# Patient Record
Sex: Female | Born: 1970 | Race: Black or African American | Hispanic: No | Smoking: Never smoker
Health system: Southern US, Community
[De-identification: ages and names within clinical notes are randomized; demographics above are authoritative.]

## PROBLEM LIST (undated history)

## (undated) DIAGNOSIS — D259 Leiomyoma of uterus, unspecified: Secondary | ICD-10-CM

## (undated) DIAGNOSIS — I10 Essential (primary) hypertension: Secondary | ICD-10-CM

## (undated) DIAGNOSIS — T7840XA Allergy, unspecified, initial encounter: Secondary | ICD-10-CM

## (undated) DIAGNOSIS — Z5189 Encounter for other specified aftercare: Secondary | ICD-10-CM

## (undated) HISTORY — DX: Encounter for other specified aftercare: Z51.89

## (undated) HISTORY — DX: Allergy, unspecified, initial encounter: T78.40XA

## (undated) HISTORY — DX: Leiomyoma of uterus, unspecified: D25.9

---

## 1997-07-08 ENCOUNTER — Other Ambulatory Visit: Admission: RE | Admit: 1997-07-08 | Discharge: 1997-07-08 | Payer: Self-pay | Admitting: Internal Medicine

## 2011-12-22 ENCOUNTER — Emergency Department (HOSPITAL_COMMUNITY)
Admission: EM | Admit: 2011-12-22 | Discharge: 2011-12-22 | Disposition: A | Discharge status: Home / Self Care | Payer: Self-pay | Attending: Emergency Medicine | Admitting: Emergency Medicine

## 2011-12-22 ENCOUNTER — Encounter (HOSPITAL_COMMUNITY): Payer: Self-pay | Admitting: *Deleted

## 2011-12-22 DIAGNOSIS — N390 Urinary tract infection, site not specified: Secondary | ICD-10-CM | POA: Insufficient documentation

## 2011-12-22 LAB — URINALYSIS, ROUTINE W REFLEX MICROSCOPIC
Glucose, UA: NEGATIVE mg/dL
Hgb urine dipstick: NEGATIVE
Ketones, ur: NEGATIVE mg/dL
Protein, ur: NEGATIVE mg/dL

## 2011-12-22 MED ORDER — CEPHALEXIN 500 MG PO CAPS: 500.0000 mg | ORAL_CAPSULE | Freq: Two times a day (BID) | ORAL | Status: DC

## 2011-12-22 NOTE — ED Notes (Signed)
Patient states she thinks she has uti, pressure, and frequent urination.  She is also complaining of back pain.  She has had sx for 2 weeks that is worsened

## 2011-12-22 NOTE — ED Provider Notes (Signed)
History    This chart was scribed for Derwood Kaplan, MD, MD by Smitty Pluck. The patient was seen in room TR08C and the patient's care was started at 7:01PM.   CSN: [id number]  Arrival date & time 12/22/11  1620      Chief Complaint  Patient presents with  . Urinary Tract Infection    (Consider location/radiation/quality/duration/timing/severity/associated sxs/prior treatment) Patient is a 41 y.o. female presenting with urinary tract infection. The history is provided by the patient. No language interpreter was used.  Urinary Tract Infection   Ann Bird is a 41 y.o. female who presents to the Emergency Department complaining of constant, moderate bladder pressure and frequency onset 2 weeks ago. Pt reports having lower back pain. She reports that symptoms have worsened since onset. She states that she has taken AZO medication without relief. She reports that she has hx of kidney infection, kidney stones and UTI. Reports this does not feel like past kidney stones but feels similar to past UTI. Pt denies vaginal discharge, hx of STD and risky sexual behavior.   History reviewed. No pertinent past medical history.  History reviewed. No pertinent past surgical history.  No family history on file.  History  Substance Use Topics  . Smoking status: Never Smoker   . Smokeless tobacco: Not on file  . Alcohol Use: Yes    OB History    Grav Para Term Preterm Abortions TAB SAB Ect Mult Living                  Review of Systems  Genitourinary: Positive for frequency.  Musculoskeletal: Positive for back pain.  All other systems reviewed and are negative.    Allergies  Review of patient's allergies indicates not on file.  Home Medications  No current outpatient prescriptions on file.  BP 129/83  Pulse 97  Temp 98.5 F (36.9 C) (Oral)  Resp 16  Ht 5\' 1"  (1.549 m)  Wt 185 lb (83.915 kg)  BMI 34.96 kg/m2  SpO2 100%  Physical Exam  Nursing note and vitals  reviewed. Constitutional: She is oriented to person, place, and time. She appears well-developed and well-nourished. No distress.  HENT:  Head: Normocephalic and atraumatic.  Eyes: Conjunctivae normal are normal.  Neck: Normal range of motion. Neck supple.  Cardiovascular: Normal rate, regular rhythm and normal heart sounds.   Pulmonary/Chest: Effort normal and breath sounds normal. No respiratory distress. She has no wheezes. She has no rales.  Abdominal: Soft. She exhibits no distension. There is no tenderness. There is no CVA tenderness.  Neurological: She is alert and oriented to person, place, and time.  Skin: Skin is warm and dry.  Psychiatric: She has a normal mood and affect. Her behavior is normal.    ED Course  Procedures (including critical care time) DIAGNOSTIC STUDIES: Oxygen Saturation is 100% on room air, normal by my interpretation.    COORDINATION OF CARE: 7:05 PM Discussed ED treatment with pt     Labs Reviewed  URINALYSIS, ROUTINE W REFLEX MICROSCOPIC   No results found.   No diagnosis found.    MDM  Medical screening examination/treatment/procedure(s) were performed by me as the supervising physician. Scribe service was utilized for documentation only.  Pt comes in with cc of UTI. Has hs of UTI, and is having dysuria, flank pain and, polyuria. Appears to have pyelonephritis, though no fevers, chills, nausea noted. UA appears normal. Pt has completely non tender abd exam , has no vaginal dc, no  risk for STD. Will treat as a uti, will culture.   Derwood Kaplan, MD 12/22/11 2025

## 2011-12-22 NOTE — ED Notes (Signed)
Called lab urinalysis negative.

## 2011-12-22 NOTE — ED Notes (Signed)
Lab called for results states that they will tube to POD A

## 2011-12-24 LAB — URINE CULTURE: Colony Count: 9000

## 2012-10-30 ENCOUNTER — Encounter (HOSPITAL_COMMUNITY): Payer: Self-pay | Admitting: *Deleted

## 2012-10-30 ENCOUNTER — Emergency Department (HOSPITAL_COMMUNITY)
Admission: EM | Admit: 2012-10-30 | Discharge: 2012-10-30 | Disposition: A | Discharge status: Home / Self Care | Payer: BC Managed Care – PPO | Source: Home / Self Care | Attending: Family Medicine | Admitting: Family Medicine

## 2012-10-30 DIAGNOSIS — R300 Dysuria: Secondary | ICD-10-CM

## 2012-10-30 LAB — POCT URINALYSIS DIP (DEVICE)
Hgb urine dipstick: NEGATIVE
Protein, ur: 100 mg/dL — AB
Specific Gravity, Urine: >=1.03 (ref 1.005–1.030)
Urobilinogen, UA: 0.2 mg/dL (ref 0.0–1.0)

## 2012-10-30 MED ORDER — TRAMADOL HCL 50 MG PO TABS: 50.0000 mg | ORAL_TABLET | Freq: Four times a day (QID) | ORAL | Status: DC | PRN

## 2012-10-30 MED ORDER — CEPHALEXIN 500 MG PO CAPS: 500.0000 mg | ORAL_CAPSULE | Freq: Three times a day (TID) | ORAL | Status: DC

## 2012-10-30 MED ORDER — PHENAZOPYRIDINE HCL 200 MG PO TABS: 200.0000 mg | ORAL_TABLET | Freq: Three times a day (TID) | ORAL | Status: DC

## 2012-10-30 NOTE — ED Notes (Signed)
C/o pressure after urinating and feeling like she still has to go onset Monday. C/o bil. flank pain onset last week 8/14.  No fever.  No hematuria. She is taking Azo.

## 2012-10-30 NOTE — ED Provider Notes (Signed)
CSN: [id number]     Arrival date & time 10/30/12  1552 History     First MD Initiated Contact with Patient 10/30/12 1622     Chief Complaint  Patient presents with  . Urinary Tract Infection   (Consider location/radiation/quality/duration/timing/severity/associated sxs/prior Treatment) HPI Comments: 42 year old female nonsmoker with no significant past medical history. Here complaining of suprapubic pressure, urinary frequency and discomfort in urination for one week. Symptoms associated with bilateral low back pain. No hematuria. Denies fever or chills. Denies nausea vomiting or diarrhea. No pelvic pain. No vaginal discharge from what space line for her. Patient presents with similar symptoms in the emergency department in October last year her urine culture did grow 9,000 colonies of different morphotypes. She states her symptoms resolved with cephalexin.    History reviewed. No pertinent past medical history. History reviewed. No pertinent past surgical history. Family History  Problem Relation Age of Onset  . Pulmonary embolism Mother   . Colon cancer Father    History  Substance Use Topics  . Smoking status: Never Smoker   . Smokeless tobacco: Not on file  . Alcohol Use: Yes     Comment: occasional   OB History   Grav Para Term Preterm Abortions TAB SAB Ect Mult Living                 Review of Systems  Constitutional: Negative for fever, chills, diaphoresis, activity change, appetite change and fatigue.  Gastrointestinal: Negative for nausea, vomiting, abdominal pain, diarrhea and constipation.  Genitourinary: Positive for dysuria and frequency. Negative for hematuria, vaginal bleeding, vaginal discharge, genital sores and pelvic pain.  Musculoskeletal: Positive for back pain.  Skin: Negative for rash.  Neurological: Negative for dizziness and headaches.  All other systems reviewed and are negative.    Allergies  Review of patient's allergies indicates no known  allergies.  Home Medications   Current Outpatient Rx  Name  Route  Sig  Dispense  Refill  . cephALEXin (KEFLEX) 500 MG capsule   Oral   Take 1 capsule (500 mg total) by mouth 3 (three) times daily.   21 capsule   0   . phenazopyridine (PYRIDIUM) 200 MG tablet   Oral   Take 1 tablet (200 mg total) by mouth 3 (three) times daily.   6 tablet   0   . traMADol (ULTRAM) 50 MG tablet   Oral   Take 1 tablet (50 mg total) by mouth every 6 (six) hours as needed for pain.   15 tablet   0    LMP 09/23/2012 Physical Exam  Nursing note and vitals reviewed. Constitutional: She is oriented to person, place, and time. She appears well-developed and well-nourished. No distress.  HENT:  Head: Normocephalic and atraumatic.  Mouth/Throat: Oropharynx is clear and moist.  Cardiovascular: Normal heart sounds.   Pulmonary/Chest: Breath sounds normal.  Abdominal: Soft. Bowel sounds are normal. She exhibits no distension and no mass. There is no rebound and no guarding.  Suprapubic discomfort with deep palpation. No CVT bilateral.   Neurological: She is alert and oriented to person, place, and time.  Skin: No rash noted. She is not diaphoretic.    ED Course   Procedures (including critical care time)  Labs Reviewed  POCT URINALYSIS DIP (DEVICE) - Abnormal; Notable for the following:    Protein, ur 100 (*)    Nitrite POSITIVE (*)    All other components within normal limits  GLUCOSE, CAPILLARY   No results found. 1. Dysuria  MDM  Urine sent for culture. Treated with Keflex, pyridium and tramadol. Supportive care and red flags that should prompt her return to medical attention discussed with patient and provided in writing. Specifically recommended patient to followup with her primary care provider or urologist if recurrent symptoms.  Sharin Grave, MD 11/01/12 [phone]

## 2012-10-31 LAB — URINE CULTURE: Special Requests: NORMAL

## 2012-11-01 MED ORDER — METRONIDAZOLE 500 MG PO TABS: 500.0000 mg | ORAL_TABLET | Freq: Two times a day (BID) | ORAL | Status: DC

## 2013-03-11 ENCOUNTER — Encounter (HOSPITAL_COMMUNITY): Payer: Self-pay | Admitting: Emergency Medicine

## 2013-03-11 ENCOUNTER — Emergency Department (HOSPITAL_COMMUNITY)
Admission: EM | Admit: 2013-03-11 | Discharge: 2013-03-11 | Disposition: A | Discharge status: Home / Self Care | Payer: BC Managed Care – PPO | Source: Home / Self Care

## 2013-03-11 DIAGNOSIS — R0982 Postnasal drip: Secondary | ICD-10-CM

## 2013-03-11 DIAGNOSIS — J069 Acute upper respiratory infection, unspecified: Secondary | ICD-10-CM

## 2013-03-11 MED ORDER — IPRATROPIUM BROMIDE 0.06 % NA SOLN: 2.0000 | Freq: Four times a day (QID) | NASAL | Status: DC

## 2013-03-11 NOTE — ED Notes (Signed)
42 yr old is here today complaints of cough-yellow, chest congestion, and sorethroat for three weeks. She states she has tried OTC medications with no success. She states her breathing seems alittle off from time to time. Denies: Chest pain

## 2013-03-11 NOTE — ED Provider Notes (Signed)
CSN: [id number]     Arrival date & time 03/11/13  0813 History   First MD Initiated Contact with Patient 03/11/13 [phone]     Chief Complaint  Patient presents with  . URI   (Consider location/radiation/quality/duration/timing/severity/associated sxs/prior Treatment) HPI Comments: 42 year old female complaining of URI symptoms with cough, draining and sore throat for 3-4 weeks.   History reviewed. No pertinent past medical history. History reviewed. No pertinent past surgical history. Family History  Problem Relation Age of Onset  . Pulmonary embolism Mother   . Colon cancer Father    History  Substance Use Topics  . Smoking status: Never Smoker   . Smokeless tobacco: Not on file  . Alcohol Use: Yes     Comment: occasional   OB History   Grav Para Term Preterm Abortions TAB SAB Ect Mult Living                 Review of Systems  Constitutional: Negative for fever, chills, activity change, appetite change and fatigue.  HENT: Positive for congestion, postnasal drip, rhinorrhea and sore throat. Negative for facial swelling.   Eyes: Negative.   Respiratory: Negative.  Negative for chest tightness and wheezing.   Cardiovascular: Negative.   Gastrointestinal: Negative.   Musculoskeletal: Negative for neck pain and neck stiffness.  Skin: Negative for pallor and rash.  Neurological: Negative.     Allergies  Review of patient's allergies indicates no known allergies.  Home Medications   Current Outpatient Rx  Name  Route  Sig  Dispense  Refill  . cephALEXin (KEFLEX) 500 MG capsule   Oral   Take 1 capsule (500 mg total) by mouth 3 (three) times daily.   21 capsule   0   . ipratropium (ATROVENT) 0.06 % nasal spray   Each Nare   Place 2 sprays into both nostrils 4 (four) times daily.   15 mL   12   . phenazopyridine (PYRIDIUM) 200 MG tablet   Oral   Take 1 tablet (200 mg total) by mouth 3 (three) times daily.   6 tablet   0   . traMADol (ULTRAM) 50 MG tablet  Oral   Take 1 tablet (50 mg total) by mouth every 6 (six) hours as needed for pain.   15 tablet   0    BP 128/86  Pulse 86  Temp(Src) 98.4 F (36.9 C) (Oral)  Resp 16  Ht 5\' 1"  (1.549 m)  Wt 190 lb (86.183 kg)  BMI 35.92 kg/m2  SpO2 99%  LMP 03/06/2013 Physical Exam  Nursing note and vitals reviewed. Constitutional: She is oriented to person, place, and time. She appears well-developed and well-nourished. No distress.  HENT:  Mouth/Throat: No oropharyngeal exudate.  Bilateral TMs are normal Oropharynx with minimal erythema and clear PND.  Eyes: Conjunctivae and EOM are normal.  Neck: Normal range of motion. Neck supple.  Cardiovascular: Normal rate, regular rhythm and normal heart sounds.   Pulmonary/Chest: Effort normal and breath sounds normal. No respiratory distress. She has no wheezes. She has no rales.  Musculoskeletal: Normal range of motion. She exhibits no edema.  Lymphadenopathy:    She has no cervical adenopathy.  Neurological: She is alert and oriented to person, place, and time.  Skin: Skin is warm and dry. No rash noted.  Psychiatric: She has a normal mood and affect.    ED Course  Procedures (including critical care time) Labs Review Labs Reviewed - No data to display Imaging Review No results found.  MDM   1. URI (upper respiratory infection)   2. PND (post-nasal drip)    Alka-Seltzer cold nighttime plus medication Cough or nondrowsy may use Claritin or Allegra Drink plenty of fluids Atrovent nasal spray Instructions for treating URI symptoms.   Hayden Rasmussen, NP 03/11/13 [phone]

## 2013-03-15 NOTE — ED Provider Notes (Signed)
Medical screening examination/treatment/procedure(s) were performed by a resident physician or non-physician practitioner and as the supervising physician I was immediately available for consultation/collaboration.  Lynne Leader, MD    Gregor Hams, MD 03/15/13 [phone]

## 2013-04-16 ENCOUNTER — Ambulatory Visit (INDEPENDENT_AMBULATORY_CARE_PROVIDER_SITE_OTHER): Payer: No Typology Code available for payment source | Admitting: Physician Assistant

## 2013-04-16 VITALS — BP 112/72 | HR 120 | Temp 99.1°F | Resp 16 | Ht 61.0 in | Wt 194.0 lb

## 2013-04-16 DIAGNOSIS — R059 Cough, unspecified: Secondary | ICD-10-CM

## 2013-04-16 DIAGNOSIS — R05 Cough: Secondary | ICD-10-CM

## 2013-04-16 DIAGNOSIS — J069 Acute upper respiratory infection, unspecified: Secondary | ICD-10-CM

## 2013-04-16 MED ORDER — HYDROCOD POLST-CHLORPHEN POLST 10-8 MG/5ML PO LQCR: 5.0000 mL | Freq: Two times a day (BID) | ORAL | Status: AC

## 2013-04-16 MED ORDER — AZITHROMYCIN 250 MG PO TABS
ORAL_TABLET | ORAL | Status: AC
Start: 2013-04-16 — End: 2013-04-21

## 2013-04-16 NOTE — Progress Notes (Signed)
   Subjective:    Patient ID: Ann Bird, female    DOB: Dec 01, 1970, 43 y.o.   MRN: [id number]  HPI Pt presents to clinic with cold symptoms for about 5 days. She had the Flu at the beginning of January and then she got better except for the cough which never resolved and now she has a productive cough (all day) with some nasal congestion but with clear rhinorrhea.  OTC meds - Nyquil, Mucinex Sick contacts - work Flu vaccine - no -- thinks she had the flu about 3-4 weeks ago Review of Systems  Constitutional: Positive for fever (subjective) and chills.  HENT: Positive for congestion, postnasal drip, rhinorrhea (clear) and sore throat (resolved).   Respiratory: Positive for cough (yellowish).   Gastrointestinal: Negative for nausea, vomiting and diarrhea.  Musculoskeletal: Positive for myalgias.  Neurological: Positive for headaches.  All other systems reviewed and are negative.       Objective:   Physical Exam  Vitals reviewed. Constitutional: She is oriented to person, place, and time. She appears well-developed and well-nourished.  HENT:  Head: Normocephalic and atraumatic.  Right Ear: Hearing, tympanic membrane, external ear and ear canal normal.  Left Ear: Hearing, tympanic membrane, external ear and ear canal normal.  Nose: Mucosal edema (red) present.  Mouth/Throat: Uvula is midline, oropharynx is clear and moist and mucous membranes are normal.  Eyes: Conjunctivae are normal.  Neck: Normal range of motion. Neck supple.  Cardiovascular: Normal rate, regular rhythm and normal heart sounds.   No murmur heard. Pulmonary/Chest: Effort normal and breath sounds normal. She has no wheezes.  Lymphadenopathy:    She has no cervical adenopathy.  Neurological: She is alert and oriented to person, place, and time.  Skin: Skin is warm and dry.  Psychiatric: She has a normal mood and affect. Her behavior is normal. Judgment and thought content normal.      Assessment & Plan:    Cough - Plan: azithromycin (ZITHROMAX Z-PAK) 250 MG tablet, chlorpheniramine-HYDROcodone (TUSSIONEX PENNKINETIC ER) 10-8 MG/5ML LQCR  URI (upper respiratory infection)  Symptomatic care d/w pt. We will cover for atypical bacteria since she has had a cough for about 4 weeks and her sputum is a different color than her am rhinorrhea.  Windell Hummingbird PA-C 04/16/2013 4:57 PM

## 2013-04-16 NOTE — Patient Instructions (Signed)
Please push fluids.  Tylenol and Motrin for fever and body aches.    

## 2013-08-18 ENCOUNTER — Encounter (HOSPITAL_COMMUNITY): Payer: Self-pay | Admitting: Emergency Medicine

## 2013-08-18 ENCOUNTER — Other Ambulatory Visit (HOSPITAL_COMMUNITY)
Admission: RE | Admit: 2013-08-18 | Discharge: 2013-08-18 | Disposition: A | Discharge status: Home / Self Care | Payer: PRIVATE HEALTH INSURANCE | Source: Ambulatory Visit | Attending: Emergency Medicine | Admitting: Emergency Medicine

## 2013-08-18 ENCOUNTER — Emergency Department (HOSPITAL_COMMUNITY)
Admission: EM | Admit: 2013-08-18 | Discharge: 2013-08-18 | Disposition: A | Discharge status: Home / Self Care | Payer: PRIVATE HEALTH INSURANCE | Source: Home / Self Care | Attending: Emergency Medicine | Admitting: Emergency Medicine

## 2013-08-18 DIAGNOSIS — Z113 Encounter for screening for infections with a predominantly sexual mode of transmission: Secondary | ICD-10-CM | POA: Insufficient documentation

## 2013-08-18 DIAGNOSIS — N760 Acute vaginitis: Secondary | ICD-10-CM | POA: Insufficient documentation

## 2013-08-18 LAB — POCT URINALYSIS DIP (DEVICE)
Bilirubin Urine: NEGATIVE
GLUCOSE, UA: NEGATIVE mg/dL
HGB URINE DIPSTICK: NEGATIVE
KETONES UR: NEGATIVE mg/dL
Nitrite: NEGATIVE
PROTEIN: 100 mg/dL — AB
UROBILINOGEN UA: 0.2 mg/dL (ref 0.0–1.0)
pH: 6.5 (ref 5.0–8.0)

## 2013-08-18 LAB — POCT PREGNANCY, URINE: PREG TEST UR: NEGATIVE

## 2013-08-18 MED ORDER — METRONIDAZOLE 500 MG PO TABS: 500.0000 mg | ORAL_TABLET | Freq: Two times a day (BID) | ORAL | Status: DC

## 2013-08-18 NOTE — ED Provider Notes (Signed)
Medical screening examination/treatment/procedure(s) were performed by non-physician practitioner and as supervising physician I was immediately available for consultation/collaboration.  Philipp Deputy, M.D.  Harden Mo, MD 08/18/13 2226

## 2013-08-18 NOTE — Discharge Instructions (Signed)
Bacterial Vaginosis Bacterial vaginosis is a vaginal infection that occurs when the normal balance of bacteria in the vagina is disrupted. It results from an overgrowth of certain bacteria. This is the most common vaginal infection in women of childbearing age. Treatment is important to prevent complications, especially in pregnant women, as it can cause a premature delivery. CAUSES  Bacterial vaginosis is caused by an increase in harmful bacteria that are normally present in smaller amounts in the vagina. Several different kinds of bacteria can cause bacterial vaginosis. However, the reason that the condition develops is not fully understood. RISK FACTORS Certain activities or behaviors can put you at an increased risk of developing bacterial vaginosis, including:  Having a new sex partner or multiple sex partners.  Douching.  Using an intrauterine device (IUD) for contraception. Women do not get bacterial vaginosis from toilet seats, bedding, swimming pools, or contact with objects around them. SIGNS AND SYMPTOMS  Some women with bacterial vaginosis have no signs or symptoms. Common symptoms include:  Grey vaginal discharge.  A fishlike odor with discharge, especially after sexual intercourse.  Itching or burning of the vagina and vulva.  Burning or pain with urination. DIAGNOSIS  Your health care provider will take a medical history and examine the vagina for signs of bacterial vaginosis. A sample of vaginal fluid may be taken. Your health care provider will look at this sample under a microscope to check for bacteria and abnormal cells. A vaginal pH test may also be done.  TREATMENT  Bacterial vaginosis may be treated with antibiotic medicines. These may be given in the form of a pill or a vaginal cream. A second round of antibiotics may be prescribed if the condition comes back after treatment.  HOME CARE INSTRUCTIONS   Only take over-the-counter or prescription medicines as  directed by your health care provider.  If antibiotic medicine was prescribed, take it as directed. Make sure you finish it even if you start to feel better.  Do not have sex until treatment is completed.  Tell all sexual partners that you have a vaginal infection. They should see their health care provider and be treated if they have problems, such as a mild rash or itching.  Practice safe sex by using condoms and only having one sex partner. SEEK MEDICAL CARE IF:   Your symptoms are not improving after 3 days of treatment.  You have increased discharge or pain.  You have a fever. MAKE SURE YOU:   Understand these instructions.  Will watch your condition.  Will get help right away if you are not doing well or get worse. FOR MORE INFORMATION  Centers for Disease Control and Prevention, Division of STD Prevention: [employer] American Sexual Health Association (ASHA): www.ashastd.org  Document Released: 02/26/2005 Document Revised: 12/17/2012 Document Reviewed: 10/08/2012 [facility] Patient Information 2014 [place].  Vaginitis Vaginitis is an inflammation of the vagina. It is most often caused by a change in the normal balance of the bacteria and yeast that live in the vagina. This change in balance causes an overgrowth of certain bacteria or yeast, which causes the inflammation. There are different types of vaginitis, but the most common types are:  Bacterial vaginosis.  Yeast infection (candidiasis).  Trichomoniasis vaginitis. This is a sexually transmitted infection (STI).  Viral vaginitis.  Atropic vaginitis.  Allergic vaginitis. CAUSES  The cause depends on the type of vaginitis. Vaginitis can be caused by:  Bacteria (bacterial vaginosis).  Yeast (yeast infection).  A parasite (trichomoniasis vaginitis)  A virus (viral vaginitis).  Low hormone levels (atrophic vaginitis). Low hormone levels can occur during pregnancy, breastfeeding, or after  menopause.  Irritants, such as bubble baths, scented tampons, and feminine sprays (allergic vaginitis). Other factors can change the normal balance of the yeast and bacteria that live in the vagina. These include:  Antibiotic medicines.  Poor hygiene.  Diaphragms, vaginal sponges, spermicides, birth control pills, and intrauterine devices (IUD).  Sexual intercourse.  Infection.  Uncontrolled diabetes.  A weakened immune system. SYMPTOMS  Symptoms can vary depending on the cause of the vaginitis. Common symptoms include:  Abnormal vaginal discharge.  The discharge is white, gray, or yellow with bacterial vaginosis.  The discharge is thick, white, and cheesy with a yeast infection.  The discharge is frothy and yellow or greenish with trichomoniasis.  A bad vaginal odor.  The odor is fishy with bacterial vaginosis.  Vaginal itching, pain, or swelling.  Painful intercourse.  Pain or burning when urinating. Sometimes, there are no symptoms. TREATMENT  Treatment will vary depending on the type of infection.   Bacterial vaginosis and trichomoniasis are often treated with antibiotic creams or pills.  Yeast infections are often treated with antifungal medicines, such as vaginal creams or suppositories.  Viral vaginitis has no cure, but symptoms can be treated with medicines that relieve discomfort. Your sexual partner should be treated as well.  Atrophic vaginitis may be treated with an estrogen cream, pill, suppository, or vaginal ring. If vaginal dryness occurs, lubricants and moisturizing creams may help. You may be told to avoid scented soaps, sprays, or douches.  Allergic vaginitis treatment involves quitting the use of the product that is causing the problem. Vaginal creams can be used to treat the symptoms. HOME CARE INSTRUCTIONS   Take all medicines as directed by your caregiver.  Keep your genital area clean and dry. Avoid soap and only rinse the area with  water.  Avoid douching. It can remove the healthy bacteria in the vagina.  Do not use tampons or have sexual intercourse until your vaginitis has been treated. Use sanitary pads while you have vaginitis.  Wipe from front to back. This avoids the spread of bacteria from the rectum to the vagina.  Let air reach your genital area.  Wear cotton underwear to decrease moisture buildup.  Avoid wearing underwear while you sleep until your vaginitis is gone.  Avoid tight pants and underwear or nylons without a cotton panel.  Take off wet clothing (especially bathing suits) as soon as possible.  Use mild, non-scented products. Avoid using irritants, such as:  Scented feminine sprays.  Fabric softeners.  Scented detergents.  Scented tampons.  Scented soaps or bubble baths.  Practice safe sex and use condoms. Condoms may prevent the spread of trichomoniasis and viral vaginitis. SEEK MEDICAL CARE IF:   You have abdominal pain.  You have a fever or persistent symptoms for more than 2 3 days.  You have a fever and your symptoms suddenly get worse. Document Released: 12/24/2006 Document Revised: 11/21/2011 Document Reviewed: 08/09/2011 [facility] Patient Information 2014 [place].

## 2013-08-18 NOTE — ED Notes (Signed)
PT  REPORTS  SYMPTOMS  OF    UTI  TO  INCLUDE  LOW  BACK PAI  SLIGHT  DISCHARGE  AS  WELL  AAS  DISCOMFORT      X  1       WEEK    SYMPTOMS  NOT RELEIVED  BY  AZO

## 2013-08-18 NOTE — ED Provider Notes (Signed)
CSN: [id number]     Arrival date & time 08/18/13  6812 History   First MD Initiated Contact with Patient 08/18/13 1028     Chief Complaint  Patient presents with  . Urinary Frequency   (Consider location/radiation/quality/duration/timing/severity/associated sxs/prior Treatment) HPI Comments: Reports suprapubic abdominal discomfort, lower back discomfort, urinary frequency without associated fever, flank pain, N/V, chills or hematuria over past 6 days. Also mentions new yellow vaginal discharge. No vaginal bleeding No polyuria or polydipsia LNMP: 07-31-2013 PCP: none   Patient is a 43 y.o. female presenting with frequency. The history is provided by the patient.  Urinary Frequency This is a new problem. Episode onset: sx began 6 days ago. The problem has been gradually worsening.    History reviewed. No pertinent past medical history. History reviewed. No pertinent past surgical history. Family History  Problem Relation Age of Onset  . Pulmonary embolism Mother   . Colon cancer Father    History  Substance Use Topics  . Smoking status: Never Smoker   . Smokeless tobacco: Not on file  . Alcohol Use: Yes     Comment: occasional   OB History   Grav Para Term Preterm Abortions TAB SAB Ect Mult Living                 Review of Systems  Genitourinary: Positive for frequency.  All other systems reviewed and are negative.   Allergies  Review of patient's allergies indicates no known allergies.  Home Medications   Prior to Admission medications   Medication Sig Start Date End Date Taking? Authorizing Provider  metroNIDAZOLE (FLAGYL) 500 MG tablet Take 1 tablet (500 mg total) by mouth 2 (two) times daily. 08/18/13   Annett Gula Iriana Artley, PA   BP 124/88  Pulse 90  Temp(Src) 98.2 F (36.8 C) (Oral)  Resp 16  SpO2 98%  LMP 07/31/2013 Physical Exam  Nursing note and vitals reviewed. Constitutional: She is oriented to person, place, and time. She appears well-developed and  well-nourished. No distress.  HENT:  Head: Normocephalic and atraumatic.  Eyes: Conjunctivae are normal.  Cardiovascular: Normal rate, regular rhythm and normal heart sounds.   Pulmonary/Chest: Effort normal and breath sounds normal.  Abdominal: Soft. Bowel sounds are normal. She exhibits no distension. There is tenderness in the suprapubic area. There is no CVA tenderness.  Mild pressure sensation in suprapubic region of abdomen  Genitourinary: Uterus normal. There is no rash, tenderness or lesion on the right labia. There is no rash, tenderness or lesion on the left labia. Cervix exhibits no motion tenderness, no discharge and no friability. Right adnexum displays no mass, no tenderness and no fullness. Left adnexum displays no mass, no tenderness and no fullness. No erythema, tenderness or bleeding around the vagina. No foreign body around the vagina. No signs of injury around the vagina. Vaginal discharge found.  Moderate thin yellow vaginal discharge  Musculoskeletal: Normal range of motion.  Neurological: She is alert and oriented to person, place, and time.  Skin: Skin is warm and dry. No rash noted. No erythema.  Psychiatric: She has a normal mood and affect. Her behavior is normal.    ED Course  Procedures (including critical care time) Labs Review Labs Reviewed  POCT URINALYSIS DIP (DEVICE) - Abnormal; Notable for the following:    Protein, ur 100 (*)    Leukocytes, UA TRACE (*)    All other components within normal limits  POCT PREGNANCY, URINE  CERVICOVAGINAL ANCILLARY ONLY    Imaging Review  No results found.   MDM   1. Vaginitis    UA grossly normal. Will send specimen for C&S. Patient denies concerns for STIs and does not wish to be treated empirically for gonorrhea and chlamydia. Will treat for BV and await additional lab results to determine if additional treatment needed.     [place], [place] 08/18/13 1126

## 2013-08-20 NOTE — ED Notes (Signed)
GC/Chlamydia neg., Affirm: Candida and Trich neg., Gardnerella pos.  Pt. adequately treated  With Flagyl. Roselyn Meier 08/20/2013

## 2013-12-11 ENCOUNTER — Encounter (HOSPITAL_COMMUNITY): Payer: Self-pay | Admitting: Emergency Medicine

## 2013-12-11 ENCOUNTER — Emergency Department (HOSPITAL_COMMUNITY)
Admission: EM | Admit: 2013-12-11 | Discharge: 2013-12-11 | Disposition: A | Discharge status: Home / Self Care | Payer: No Typology Code available for payment source | Source: Home / Self Care | Attending: Family Medicine | Admitting: Family Medicine

## 2013-12-11 DIAGNOSIS — N390 Urinary tract infection, site not specified: Secondary | ICD-10-CM

## 2013-12-11 LAB — POCT URINALYSIS DIP (DEVICE)
BILIRUBIN URINE: NEGATIVE
Glucose, UA: 100 mg/dL — AB
Hgb urine dipstick: NEGATIVE
KETONES UR: NEGATIVE mg/dL
Nitrite: POSITIVE — AB
Protein, ur: 100 mg/dL — AB
SPECIFIC GRAVITY, URINE: 1.02 (ref 1.005–1.030)
Urobilinogen, UA: 0.2 mg/dL (ref 0.0–1.0)
pH: 5 (ref 5.0–8.0)

## 2013-12-11 LAB — POCT PREGNANCY, URINE: PREG TEST UR: NEGATIVE

## 2013-12-11 MED ORDER — CEPHALEXIN 500 MG PO CAPS: 500.0000 mg | ORAL_CAPSULE | Freq: Four times a day (QID) | ORAL | Status: DC

## 2013-12-11 NOTE — ED Notes (Signed)
Patient c/o frequent urination x 1 week. Patient reports she has a small amount of discharge. She did take AZO tablets with mild pain relief. Patient is alert and oriented and in no acute distress.

## 2013-12-11 NOTE — Discharge Instructions (Signed)
Take all of medicine as directed, drink lots of fluids, see your doctor if further problems. °

## 2013-12-11 NOTE — ED Provider Notes (Signed)
CSN: [id number]     Arrival date & time 12/11/13  0820 History   First MD Initiated Contact with Patient 12/11/13 [phone]     Chief Complaint  Patient presents with  . Urinary Tract Infection   (Consider location/radiation/quality/duration/timing/severity/associated sxs/prior Treatment) Patient is a 43 y.o. female presenting with urinary tract infection. The history is provided by the patient.  Urinary Tract Infection This is a new problem. The current episode started more than 1 week ago. The problem has not changed since onset.Pertinent negatives include no abdominal pain.    History reviewed. No pertinent past medical history. History reviewed. No pertinent past surgical history. Family History  Problem Relation Age of Onset  . Pulmonary embolism Mother   . Colon cancer Father    History  Substance Use Topics  . Smoking status: Never Smoker   . Smokeless tobacco: Not on file  . Alcohol Use: Yes     Comment: occasional   OB History   Grav Para Term Preterm Abortions TAB SAB Ect Mult Living                 Review of Systems  Constitutional: Negative.   Gastrointestinal: Negative for abdominal pain.  Genitourinary: Positive for frequency. Negative for dysuria, urgency, hematuria, vaginal bleeding and menstrual problem.    Allergies  Review of patient's allergies indicates no known allergies.  Home Medications   Prior to Admission medications   Medication Sig Start Date End Date Taking? Authorizing Provider  cephALEXin (KEFLEX) 500 MG capsule Take 1 capsule (500 mg total) by mouth 4 (four) times daily. Take all of medicine and drink lots of fluids 12/11/13   Billy Fischer, MD  metroNIDAZOLE (FLAGYL) 500 MG tablet Take 1 tablet (500 mg total) by mouth 2 (two) times daily. 08/18/13   Audelia Hives Presson, PA   BP 121/81  Pulse 78  Temp(Src) 99.1 F (37.3 C) (Oral)  Resp 14  SpO2 98%  LMP 10/22/2013 Physical Exam  Nursing note and vitals reviewed. Constitutional: She  is oriented to person, place, and time. She appears well-developed and well-nourished.  Abdominal: Soft. Bowel sounds are normal. She exhibits no distension and no mass. There is no tenderness. There is no rebound and no guarding.  Neurological: She is alert and oriented to person, place, and time.  Skin: Skin is warm and dry.    ED Course  Procedures (including critical care time) Labs Review Labs Reviewed  POCT PREGNANCY, URINE    Imaging Review No results found.   MDM   1. UTI (lower urinary tract infection)        Billy Fischer, MD 12/11/13 [phone]

## 2014-09-09 ENCOUNTER — Emergency Department (INDEPENDENT_AMBULATORY_CARE_PROVIDER_SITE_OTHER): Payer: No Typology Code available for payment source

## 2014-09-09 ENCOUNTER — Encounter (HOSPITAL_COMMUNITY): Payer: Self-pay | Admitting: *Deleted

## 2014-09-09 ENCOUNTER — Emergency Department (HOSPITAL_COMMUNITY)
Admission: EM | Admit: 2014-09-09 | Discharge: 2014-09-09 | Disposition: A | Discharge status: Home / Self Care | Payer: No Typology Code available for payment source | Source: Home / Self Care | Attending: Family Medicine | Admitting: Family Medicine

## 2014-09-09 DIAGNOSIS — R0982 Postnasal drip: Secondary | ICD-10-CM

## 2014-09-09 DIAGNOSIS — R0789 Other chest pain: Secondary | ICD-10-CM

## 2014-09-09 DIAGNOSIS — R0600 Dyspnea, unspecified: Secondary | ICD-10-CM

## 2014-09-09 NOTE — Discharge Instructions (Signed)
Chest Pain (Nonspecific) For any increasing chest pain, heaviness, tightness or fullness, shortness of breath, sweating, nausea or vomiting or worsening in any way call EMS or go directly to the emergency department. Otherwise follow-up with your PCP, call tomorrow for an appointment. Additional evaluation by cardiology may be necessary.  It is often hard to give a specific diagnosis for the cause of chest pain. There is always a chance that your pain could be related to something serious, such as a heart attack or a blood clot in the lungs. You need to follow up with your health care provider for further evaluation. CAUSES   Heartburn.  Pneumonia or bronchitis.  Anxiety or stress.  Inflammation around your heart (pericarditis) or lung (pleuritis or pleurisy).  A blood clot in the lung.  A collapsed lung (pneumothorax). It can develop suddenly on its own (spontaneous pneumothorax) or from trauma to the chest.  Shingles infection (herpes zoster virus). The chest wall is composed of bones, muscles, and cartilage. Any of these can be the source of the pain.  The bones can be bruised by injury.  The muscles or cartilage can be strained by coughing or overwork.  The cartilage can be affected by inflammation and become sore (costochondritis). DIAGNOSIS  Lab tests or other studies may be needed to find the cause of your pain. Your health care provider may have you take a test called an ambulatory electrocardiogram (ECG). An ECG records your heartbeat patterns over a 24-hour period. You may also have other tests, such as:  Transthoracic echocardiogram (TTE). During echocardiography, sound waves are used to evaluate how blood flows through your heart.  Transesophageal echocardiogram (TEE).  Cardiac monitoring. This allows your health care provider to monitor your heart rate and rhythm in real time.  Holter monitor. This is a portable device that records your heartbeat and can help diagnose  heart arrhythmias. It allows your health care provider to track your heart activity for several days, if needed.  Stress tests by exercise or by giving medicine that makes the heart beat faster. TREATMENT   Treatment depends on what may be causing your chest pain. Treatment may include:  Acid blockers for heartburn.  Anti-inflammatory medicine.  Pain medicine for inflammatory conditions.  Antibiotics if an infection is present.  You may be advised to change lifestyle habits. This includes stopping smoking and avoiding alcohol, caffeine, and chocolate.  You may be advised to keep your head raised (elevated) when sleeping. This reduces the chance of acid going backward from your stomach into your esophagus. Most of the time, nonspecific chest pain will improve within 2-3 days with rest and mild pain medicine.  HOME CARE INSTRUCTIONS   If antibiotics were prescribed, take them as directed. Finish them even if you start to feel better.  For the next few days, avoid physical activities that bring on chest pain. Continue physical activities as directed.  Do not use any tobacco products, including cigarettes, chewing tobacco, or electronic cigarettes.  Avoid drinking alcohol.  Only take medicine as directed by your health care provider.  Follow your health care provider's suggestions for further testing if your chest pain does not go away.  Keep any follow-up appointments you made. If you do not go to an appointment, you could develop lasting (chronic) problems with pain. If there is any problem keeping an appointment, call to reschedule. SEEK MEDICAL CARE IF:   Your chest pain does not go away, even after treatment.  You have a rash with  blisters on your chest.  You have a fever. SEEK IMMEDIATE MEDICAL CARE IF:   You have increased chest pain or pain that spreads to your arm, neck, jaw, back, or abdomen.  You have shortness of breath.  You have an increasing cough, or you  cough up blood.  You have severe back or abdominal pain.  You feel nauseous or vomit.  You have severe weakness.  You faint.  You have chills. This is an emergency. Do not wait to see if the pain will go away. Get medical help at once. Call your local emergency services (911 in [place] Do not drive yourself to the hospital. MAKE SURE YOU:   Understand these instructions.  Will watch your condition.  Will get help right away if you are not doing well or get worse. Document Released: 12/06/2004 Document Revised: 03/03/2013 Document Reviewed: 10/02/2007 [facility] Patient Information 2015 [place], [place]. This information is not intended to replace advice given to you by your health care provider. Make sure you discuss any questions you have with your health care provider.  Costochondritis Costochondritis, sometimes called Tietze syndrome, is a swelling and irritation (inflammation) of the tissue (cartilage) that connects your ribs with your breastbone (sternum). It causes pain in the chest and rib area. Costochondritis usually goes away on its own over time. It can take up to 6 weeks or longer to get better, especially if you are unable to limit your activities. CAUSES  Some cases of costochondritis have no known cause. Possible causes include:  Injury (trauma).  Exercise or activity such as lifting.  Severe coughing. SIGNS AND SYMPTOMS  Pain and tenderness in the chest and rib area.  Pain that gets worse when coughing or taking deep breaths.  Pain that gets worse with specific movements. DIAGNOSIS  Your health care provider will do a physical exam and ask about your symptoms. Chest X-rays or other tests may be done to rule out other problems. TREATMENT  Costochondritis usually goes away on its own over time. Your health care provider may prescribe medicine to help relieve pain. HOME CARE INSTRUCTIONS   Avoid exhausting physical activity. Try not to strain your ribs during  normal activity. This would include any activities using chest, abdominal, and side muscles, especially if heavy weights are used.  Apply ice to the affected area for the first 2 days after the pain begins.  Put ice in a plastic bag.  Place a towel between your skin and the bag.  Leave the ice on for 20 minutes, 2-3 times a day.  Only take over-the-counter or prescription medicines as directed by your health care provider. SEEK MEDICAL CARE IF:  You have redness or swelling at the rib joints. These are signs of infection.  Your pain does not go away despite rest or medicine. SEEK IMMEDIATE MEDICAL CARE IF:   Your pain increases or you are very uncomfortable.  You have shortness of breath or difficulty breathing.  You cough up blood.  You have worse chest pains, sweating, or vomiting.  You have a fever or persistent symptoms for more than 2-3 days.  You have a fever and your symptoms suddenly get worse. MAKE SURE YOU:   Understand these instructions.  Will watch your condition.  Will get help right away if you are not doing well or get worse. Document Released: 12/06/2004 Document Revised: 12/17/2012 Document Reviewed: 09/30/2012 [facility] Patient Information 2015 [place], [place]. This information is not intended to replace advice given to you by your  health care provider. Make sure you discuss any questions you have with your health care provider.  Chest Wall Pain Chest wall pain is pain felt in or around the chest bones and muscles. It may take up to 6 weeks to get better. It may take longer if you are active. Chest wall pain can happen on its own. Other times, things like germs, injury, coughing, or exercise can cause the pain. HOME CARE   Avoid activities that make you tired or cause pain. Try not to use your chest, belly (abdominal), or side muscles. Do not use heavy weights.  Put ice on the sore area.  Put ice in a plastic bag.  Place a towel between your skin and  the bag.  Leave the ice on for 15-20 minutes for the first 2 days.  Only take medicine as told by your doctor. GET HELP RIGHT AWAY IF:   You have more pain or are very uncomfortable.  You have a fever.  Your chest pain gets worse.  You have new problems.  You feel sick to your stomach (nauseous) or throw up (vomit).  You start to sweat or feel lightheaded.  You have a cough with mucus (phlegm).  You cough up blood. MAKE SURE YOU:   Understand these instructions.  Will watch your condition.  Will get help right away if you are not doing well or get worse. Document Released: 08/15/2007 Document Revised: 05/21/2011 Document Reviewed: 10/23/2010 [facility] Patient Information 2015 [place], [place]. This information is not intended to replace advice given to you by your health care provider. Make sure you discuss any questions you have with your health care provider.  Shortness of Breath Shortness of breath means you have trouble breathing. Shortness of breath needs medical care right away. HOME CARE   Do not smoke.  Avoid being around chemicals or things (paint fumes, dust) that may bother your breathing.  Rest as needed. Slowly begin your normal activities.  Only take medicines as told by your doctor.  Keep all doctor visits as told. GET HELP RIGHT AWAY IF:   Your shortness of breath gets worse.  You feel lightheaded, pass out (faint), or have a cough that is not helped by medicine.  You cough up blood.  You have pain with breathing.  You have pain in your chest, arms, shoulders, or belly (abdomen).  You have a fever.  You cannot walk up stairs or exercise the way you normally do.  You do not get better in the time expected.  You have a hard time doing normal activities even with rest.  You have problems with your medicines.  You have any new symptoms. MAKE SURE YOU:  Understand these instructions.  Will watch your condition.  Will get help right  away if you are not doing well or get worse. Document Released: 08/15/2007 Document Revised: 03/03/2013 Document Reviewed: 05/14/2011 [facility] Patient Information 2015 [place], [place]. This information is not intended to replace advice given to you by your health care provider. Make sure you discuss any questions you have with your health care provider.

## 2014-09-09 NOTE — ED Provider Notes (Signed)
CSN: [id number]     Arrival date & time 09/09/14  1639 History   First MD Initiated Contact with Patient 09/09/14 1713     Chief Complaint  Patient presents with  . Chest Pain   (Consider location/radiation/quality/duration/timing/severity/associated sxs/prior Treatment) HPI Comments: 44 year old female states that 2 weeks ago she had upper respiratory congestion, PND, cough, sinus congestion and drainage into her chest that she was diagnosed as bronchitis and treated with antibiotics. The ABX's did not seem to work and she had the symptoms for several additional days. Most of the symptoms have now abated and she is now complaining of tightness and heaviness in the mid chest as well as breathing heavier. She does not call it shortness of breath. She currently has minor PND particularly in the morning.  she states that just a few days ago she was walking and had mild heaviness in the mid chest that when it started raining she began to run a short distance and the heaviness and tightness increased substantially.   No hx of HTN, dyslipidemia, smoking, DM.   History reviewed. No pertinent past medical history. History reviewed. No pertinent past surgical history. Family History  Problem Relation Age of Onset  . Pulmonary embolism Mother   . Colon cancer Father    History  Substance Use Topics  . Smoking status: Never Smoker   . Smokeless tobacco: Not on file  . Alcohol Use: Yes     Comment: occasional   OB History    No data available     Review of Systems  Constitutional: Negative.   HENT: Negative.   Respiratory: Positive for chest tightness and shortness of breath. Negative for choking and wheezing.   Cardiovascular: Positive for chest pain. Negative for palpitations.  Gastrointestinal: Negative.   Skin: Negative.   Neurological: Negative.     Allergies  Review of patient's allergies indicates no known allergies.  Home Medications   Prior to Admission medications    Medication Sig Start Date End Date Taking? Authorizing Provider  cephALEXin (KEFLEX) 500 MG capsule Take 1 capsule (500 mg total) by mouth 4 (four) times daily. Take all of medicine and drink lots of fluids 12/11/13   Billy Fischer, MD  metroNIDAZOLE (FLAGYL) 500 MG tablet Take 1 tablet (500 mg total) by mouth 2 (two) times daily. 08/18/13   Annett Gula H Presson, PA   BP 132/78 mmHg  Pulse 78  Temp(Src) 98.6 F (37 C) (Oral)  Resp 18  SpO2 100%  LMP 09/03/2014 Physical Exam  Constitutional: She is oriented to person, place, and time. She appears well-developed and well-nourished. No distress.  Relaxed posturing. No acute distress. No outward evidence of shortness of breath. Warm and dry. Speech is relaxed and lucid.  HENT:  Clear PND in the oropharynx. No exudates.  Eyes: EOM are normal.  Neck: Normal range of motion. Neck supple.  Cardiovascular: Normal rate, regular rhythm and normal heart sounds.   No murmur heard. Pulmonary/Chest: Effort normal and breath sounds normal. No respiratory distress. She has no wheezes. She has no rales. She exhibits tenderness.  There is tenderness along the bilateral parasternal borders, however, the patient states this is not the same pain for which she is presenting.   Musculoskeletal: She exhibits no edema.  Neurological: She is alert and oriented to person, place, and time.  Skin: Skin is warm and dry.  Psychiatric: She has a normal mood and affect.  Nursing note and vitals reviewed.   ED Course  Procedures (including critical care time) Labs Review Labs Reviewed - No data to display  Imaging Review Dg Chest 2 View  09/09/2014   CLINICAL DATA:  midline chest pain for two weeks, SOB and cough. No history of cardiac or respiratory disease, or diabetes. Non-smoker  EXAM: CHEST  2 VIEW  COMPARISON:  None.  FINDINGS: The heart size and mediastinal contours are within normal limits. Both lungs are clear. No pleural effusion or pneumothorax. The  visualized skeletal structures are unremarkable.  IMPRESSION: No active cardiopulmonary disease.   Electronically Signed   By: Lajean Manes M.D.   On: 09/09/2014 18:29   ED ECG REPORT   Date: 09/09/2014  Rate: 93  Rhythm: normal sinus rhythm  QRS Axis: normal  Intervals: normal  ST/T Wave abnormalities: normal  Conduction Disutrbances:none  Narrative Interpretation:   Old EKG Reviewed: none available  I have personally reviewed the EKG tracing and agree with the computerized printout as noted.   MDM   1. Atypical chest pain   2. Dyspnea   3. PND (post-nasal drip)   4. Chest wall pain     MD CALC Heart Score 2 Pt was offered the opportunity to go to the emergency Department for additional evaluation that we are unable to perform in the urgent care. She declined to do so and prefers to follow-up with her PCP.  For any increasing chest pain, heaviness, tightness or fullness, shortness of breath, sweating, nausea or vomiting or worsening in any way call EMS or go directly to the emergency department. Otherwise follow-up with your PCP, call tomorrow for an appointment. Additional evaluation by cardiology may be necessary.      Janne Napoleon, NP 09/09/14 1901

## 2014-09-09 NOTE — ED Notes (Signed)
Pt  Reports   Symptoms  Of     Cough /  Congestion        Tightness  In  Her chest     Symptoms  X  2  Weeks         Pt    Reports       That       She  Has  Been on  Anti  Biotics        Pt     Sitting  Upright     On  The  Exam table    spealking  In  Complete  sentances

## 2015-08-25 ENCOUNTER — Other Ambulatory Visit (HOSPITAL_COMMUNITY)
Admission: RE | Admit: 2015-08-25 | Discharge: 2015-08-25 | Disposition: A | Discharge status: Home / Self Care | Payer: BLUE CROSS/BLUE SHIELD | Source: Ambulatory Visit | Attending: Family Medicine | Admitting: Family Medicine

## 2015-08-25 ENCOUNTER — Ambulatory Visit (INDEPENDENT_AMBULATORY_CARE_PROVIDER_SITE_OTHER): Payer: BLUE CROSS/BLUE SHIELD | Admitting: Family Medicine

## 2015-08-25 ENCOUNTER — Encounter: Payer: Self-pay | Admitting: Family Medicine

## 2015-08-25 VITALS — BP 121/82 | HR 60 | Temp 98.7°F | Ht 61.0 in | Wt 196.2 lb

## 2015-08-25 DIAGNOSIS — N898 Other specified noninflammatory disorders of vagina: Secondary | ICD-10-CM

## 2015-08-25 DIAGNOSIS — N926 Irregular menstruation, unspecified: Secondary | ICD-10-CM

## 2015-08-25 DIAGNOSIS — N760 Acute vaginitis: Secondary | ICD-10-CM | POA: Insufficient documentation

## 2015-08-25 DIAGNOSIS — N3000 Acute cystitis without hematuria: Secondary | ICD-10-CM

## 2015-08-25 DIAGNOSIS — R300 Dysuria: Secondary | ICD-10-CM

## 2015-08-25 LAB — POCT URINALYSIS DIPSTICK
Bilirubin, UA: NEGATIVE
Glucose, UA: NEGATIVE
Ketones, UA: NEGATIVE
NITRITE UA: NEGATIVE
RBC UA: NEGATIVE
Spec Grav, UA: >=1.03
UROBILINOGEN UA: 4
pH, UA: 6

## 2015-08-25 LAB — POCT URINE PREGNANCY: PREG TEST UR: NEGATIVE

## 2015-08-25 MED ORDER — NITROFURANTOIN MONOHYD MACRO 100 MG PO CAPS: 100.0000 mg | ORAL_CAPSULE | Freq: Two times a day (BID) | ORAL | Status: DC

## 2015-08-25 NOTE — Progress Notes (Signed)
Pre visit review using our clinic review tool, if applicable. No additional management support is needed unless otherwise documented below in the visit note. 

## 2015-08-25 NOTE — Patient Instructions (Signed)
It does appear that you have a UTI- I will be in touch with your urine culture resutls. We will wait on your vaginal swabs, but I do not see any sign of a vaginal infection so far Use the macrobid as directed and let me know if you do not feel better in the next couple of days- Sooner if worse.

## 2015-08-25 NOTE — Progress Notes (Addendum)
[facility] [address], [place], [place] [address] [id number] [phone]  Date:  08/25/2015   Name:  Ann Bird   DOB:  1970/12/25   MRN:  [id number]  PCP:  Lamar Blinks, MD    Chief Complaint: No chief complaint on file.   History of Present Illness:  Ann Bird is a 45 y.o. very pleasant female patient who presents with the following:  Here today as a new patient to establish care  She has noted more frequent urination, feeling of having to void again right after voiding for about 2 weeks; it will come and go.   No dysuria, no hematuria She has also noted a yellowish vaginal discharge.  No itching or burning No belly pain, no fever, appetite is ok  She is generally in good health   Her LMP was about 6 weeks ago. She will occasionally skip a cycle.  She wonders if she might be perimenopausal.  She does have known uterine fibroids.  She has 2 grown children and has had 2 miscarriages since.  She does not highly suspect pregnancy but we will certainly do a test for her today She denies any new sexual partners or concern of STI There are no active problems to display for this patient.   Past Medical History  Diagnosis Date  . Fibroid, uterine     No past surgical history on file.  Social History  Substance Use Topics  . Smoking status: Never Smoker   . Smokeless tobacco: Never Used  . Alcohol Use: 0.0 oz/week    0 Standard drinks or equivalent per week     Comment: occasional. 1 glass of wine every other week    Family History  Problem Relation Age of Onset  . Pulmonary embolism Mother   . Colon cancer Father     No Known Allergies  Medication list has been reviewed and updated.  Current Outpatient Prescriptions on File Prior to Visit  Medication Sig Dispense Refill  . cephALEXin (KEFLEX) 500 MG capsule Take 1 capsule (500 mg total) by mouth 4 (four) times daily. Take all of medicine and  drink lots of fluids 20 capsule 0  . metroNIDAZOLE (FLAGYL) 500 MG tablet Take 1 tablet (500 mg total) by mouth 2 (two) times daily. 14 tablet 0   No current facility-administered medications on file prior to visit.    Review of Systems:  As per HPI- otherwise negative.   Physical Examination: Filed Vitals:   08/25/15 1601  BP: 121/82  Pulse: 106  Temp: 98.7 F (37.1 C)   Filed Vitals:   08/25/15 1601  Height: 5\' 1"  (1.549 m)  Weight: 196 lb 3.2 oz (88.996 kg)   Body mass index is 37.09 kg/(m^2). Ideal Body Weight: Weight in (lb) to have BMI = 25: 132  GEN: WDWN, NAD, Non-toxic, A & O x 3, looks well, overweight HEENT: Atraumatic, Normocephalic. Neck supple. No masses, No LAD. Ears and Nose: No external deformity. CV: RRR, No M/G/R. No JVD. No thrill. No extra heart sounds. PULM: CTA B, no wheezes, crackles, rhonchi. No retractions. No resp. distress. No accessory muscle use. ABD: S, NT, ND. No rebound. No HSM. EXTR: No c/c/e NEURO Normal gait.  PSYCH: Normally interactive. Conversant. Not depressed or anxious appearing.  Calm demeanor.  Pelvic: normal, no vaginal lesions or discharge. Uterus normal, no CMT, no adnexal tendereness or masses  Results for orders placed or performed in  visit on 08/25/15  POCT urinalysis dipstick  Result Value Ref Range   Color, UA Yellow    Clarity, UA Clear    Glucose, UA Negative    Bilirubin, UA Negative    Ketones, UA Negative    Spec Grav, UA >=1.030    Blood, UA Negative    pH, UA 6.0    Protein, UA Trace    Urobilinogen, UA 4.0    Nitrite, UA Negative    Leukocytes, UA small (1+) (A) Negative   HCG negative today  Assessment and Plan: Acute cystitis without hematuria - Plan: nitrofurantoin, macrocrystal-monohydrate, (MACROBID) 100 MG capsule  Dysuria - Plan: POCT urinalysis dipstick, Urine culture  Irregular menses - Plan: POCT urine pregnancy  Vaginal discharge - Plan: GC/Chlamydia Probe Amp, Cervicovaginal  ancillary only here today with likely UTI macrobid while culture is pending Await other labs and will be in touch with her asap  See patient instructions for more details.     Signed Lamar Blinks, MD  Received labs 6/17.  Called and LMOM- urine culture low growth mixed bacteria. Let me know if sx persist  Results for orders placed or performed in visit on 08/25/15  Urine culture  Result Value Ref Range   Colony Count 10,000 COLONIES/ML    Organism ID, Bacteria Multiple bacterial morphotypes present, none    Organism ID, Bacteria predominant. Suggest appropriate recollection if     Organism ID, Bacteria clinically indicated.   GC/Chlamydia Probe Amp  Result Value Ref Range   CT Probe RNA NOT DETECTED    GC Probe RNA NOT DETECTED   POCT urinalysis dipstick  Result Value Ref Range   Color, UA Yellow    Clarity, UA Clear    Glucose, UA Negative    Bilirubin, UA Negative    Ketones, UA Negative    Spec Grav, UA >=1.030    Blood, UA Negative    pH, UA 6.0    Protein, UA Trace    Urobilinogen, UA 4.0    Nitrite, UA Negative    Leukocytes, UA small (1+) (A) Negative  POCT urine pregnancy  Result Value Ref Range   Preg Test, Ur Negative Negative   Received wet prep 6/20- positive for gardnerella but this is not necessarily pathologic.  Will send her a copy of labs

## 2015-08-26 LAB — URINE CULTURE

## 2015-08-26 LAB — GC/CHLAMYDIA PROBE AMP
CT Probe RNA: NOT DETECTED
GC Probe RNA: NOT DETECTED

## 2015-08-29 LAB — CERVICOVAGINAL ANCILLARY ONLY: Wet Prep (BD Affirm): POSITIVE — AB

## 2015-08-30 ENCOUNTER — Encounter: Payer: Self-pay | Admitting: Family Medicine

## 2016-01-13 ENCOUNTER — Ambulatory Visit (INDEPENDENT_AMBULATORY_CARE_PROVIDER_SITE_OTHER): Payer: BLUE CROSS/BLUE SHIELD | Admitting: Physician Assistant

## 2016-01-13 VITALS — BP 128/82 | HR 98 | Temp 98.2°F | Resp 17 | Ht 61.5 in | Wt 201.0 lb

## 2016-01-13 DIAGNOSIS — R300 Dysuria: Secondary | ICD-10-CM

## 2016-01-13 DIAGNOSIS — N390 Urinary tract infection, site not specified: Secondary | ICD-10-CM

## 2016-01-13 LAB — POCT CBC
Granulocyte percent: 68.9 % (ref 37–80)
HCT, POC: 35.9 % — AB (ref 37.7–47.9)
Hemoglobin: 12 g/dL — AB (ref 12.2–16.2)
Lymph, poc: 3.8 — AB (ref 0.6–3.4)
MCH, POC: 22.6 pg — AB (ref 27–31.2)
MCHC: 33.3 g/dL (ref 31.8–35.4)
MCV: 67.7 fL — AB (ref 80–97)
MID (cbc): 0.8 (ref 0–0.9)
MPV: 8.3 fL (ref 0–99.8)
POC Granulocyte: 10.1 — AB (ref 2–6.9)
POC LYMPH PERCENT: 25.7 %L (ref 10–50)
POC MID %: 5.4 %M (ref 0–12)
Platelet Count, POC: 388 10*3/uL (ref 142–424)
RBC: 5.31 M/uL (ref 4.04–5.48)
RDW, POC: 16.8 %
WBC: 14.6 10*3/uL — AB (ref 4.6–10.2)

## 2016-01-13 LAB — POC MICROSCOPIC URINALYSIS (UMFC): Mucus: ABSENT

## 2016-01-13 LAB — POCT URINALYSIS DIP (MANUAL ENTRY)
Bilirubin, UA: NEGATIVE
Blood, UA: NEGATIVE
Glucose, UA: NEGATIVE
Ketones, POC UA: NEGATIVE
NITRITE UA: POSITIVE — AB
Spec Grav, UA: 1.02
Urobilinogen, UA: 0.2
pH, UA: 5.5

## 2016-01-13 MED ORDER — NITROFURANTOIN MONOHYD MACRO 100 MG PO CAPS: 100.0000 mg | ORAL_CAPSULE | Freq: Two times a day (BID) | ORAL | 0 refills | Status: DC

## 2016-01-13 NOTE — Patient Instructions (Addendum)
  Thank you for coming in today. I hope you feel we met your needs.  Feel free to call [facility] if you have any questions or further requests.  Please consider signing up for MyChart if you do not already have it, as this is a great way to communicate with me.  Best,  Whitney McVey, PA-C    Asymptomatic Bacteriuria, Female Asymptomatic bacteriuria is the presence of a large number of bacteria in your urine without the usual symptoms of burning or frequent urination. The following conditions increase the risk of asymptomatic bacteriuria:  Diabetes mellitus.  Advanced age.  Pregnancy in the first trimester.  Kidney stones.  Kidney transplants.  Leaky kidney tube valve in young children (reflux). Treatment for this condition is not needed in most people and can lead to other problems such as too much yeast and growth of resistant bacteria. However, some people, such as pregnant women, do need treatment to prevent kidney infection. Asymptomatic bacteriuria in pregnancy is also associated with fetal growth restriction, premature labor, and newborn death. HOME CARE INSTRUCTIONS Monitor your condition for any changes. The following actions may help to relieve any discomfort you are feeling:  Drink enough water and fluids to keep your urine clear or pale yellow. Go to the bathroom more often to keep your bladder empty.  Keep the area around your vagina and rectum clean. Wipe yourself from front to back after urinating. SEEK IMMEDIATE MEDICAL CARE IF:  You develop signs of an infection such as:  Burning with urination.  Frequency of voiding.  Back pain.  Fever.  You have blood in the urine.  You develop a fever. MAKE SURE YOU:  Understand these instructions.  Will watch your condition.  Will get help right away if you are not doing well or get worse.   This information is not intended to replace advice given to you by your health care provider. Make sure you discuss any  questions you have with your health care provider.   Document Released: 02/26/2005 Document Revised: 03/19/2014 Document Reviewed: 08/18/2012 Elsevier Interactive Patient Education 2016 [employer].  IF you received an x-ray today, you will receive an invoice from [facility]. Please contact [facility] at [phone] with questions or concerns regarding your invoice.   IF you received labwork today, you will receive an invoice from [employer]. Please contact [facility] at [phone] with questions or concerns regarding your invoice.   Our billing staff will not be able to assist you with questions regarding bills from these companies.  You will be contacted with the lab results as soon as they are available. The fastest way to get your results is to activate your My Chart account. Instructions are located on the last page of this paperwork. If you have not heard from [place] regarding the results in 2 weeks, please contact this office.

## 2016-01-13 NOTE — Progress Notes (Signed)
SUBJECTIVE: Ann Bird is a 45 y.o. female who complains of urinary frequency, urgency and dysuria x 7 days. Notes associated lower abdominal and lower back pain. Her HR today is 106, rechecked it was 98.  Denies flank pain, fever, chills, or vaginal discharge or bleeding.   AZO for pain, some relief from pain. Reports taking three days of 500 mg Ciprofloxacin, which was her sister's, with no relief.   OBJECTIVE: Appears well, in no apparent distress.  She is tachycardic on presentation, recheck of HR is 98 bpm, otherwise vital signs are normal. The abdomen is soft, minor suprapubic tenderness, there is no guarding, mass, rebound or organomegaly. Minimal CVA tenderness, no inguinal adenopathy noted.    Results for orders placed or performed in visit on 01/13/16  POCT Microscopic Urinalysis ([facility])  Result Value Ref Range   WBC,UR,HPF,POC Few (A) None WBC/hpf   RBC,UR,HPF,POC None None RBC/hpf   Bacteria None None, Too numerous to count   Mucus Absent Absent   Epithelial Cells, UR Per Microscopy Few (A) None, Too numerous to count cells/hpf  POCT urinalysis dipstick  Result Value Ref Range   Color, UA yellow yellow   Clarity, UA cloudy (A) clear   Glucose, UA negative negative   Bilirubin, UA negative negative   Ketones, POC UA negative negative   Spec Grav, UA 1.020    Blood, UA negative negative   pH, UA 5.5    Protein Ur, POC trace (A) negative   Urobilinogen, UA 0.2    Nitrite, UA Positive (A) Negative   Leukocytes, UA small (1+) (A) Negative  POCT CBC  Result Value Ref Range   WBC 14.6 (A) 4.6 - 10.2 K/uL   Lymph, poc 3.8 (A) 0.6 - 3.4   POC LYMPH PERCENT 25.7 10 - 50 %L   MID (cbc) 0.8 0 - 0.9   POC MID % 5.4 0 - 12 %M   POC Granulocyte 10.1 (A) 2 - 6.9   Granulocyte percent 68.9 37 - 80 %G   RBC 5.31 4.04 - 5.48 M/uL   Hemoglobin 12.0 (A) 12.2 - 16.2 g/dL   HCT, POC 35.9 (A) 37.7 - 47.9 %   MCV 67.7 (A) 80 - 97 fL   MCH, POC 22.6 (A) 27 - 31.2 pg   MCHC 33.3 31.8  - 35.4 g/dL   RDW, POC 16.8 %   Platelet Count, POC 388 142 - 424 K/uL   MPV 8.3 0 - 99.8 fL    ASSESSMENT/PLAN:  1. Dysuria 2. Urinary tract infection without hematuria, site unspecified - POCT Microscopic Urinalysis ([facility]) - POCT urinalysis dipstick - POCT CBC - Urine culture - No concern for pyelonephritis at this time. Antibiotic stewardship discussed with patient. Encouraged her not to take antibiotics not as prescribed by a medical practitioner.  - nitrofurantoin, macrocrystal-monohydrate, (MACROBID) 100 MG capsule; Take 1 capsule (100 mg total) by mouth 2 (two) times daily.  Dispense: 20 capsule; Refill: 0 - Call or return to clinic prn if these symptoms worsen or fail to improve as anticipated. She understands and agrees with the plan.    [facility] January 13, 2016, 17:17

## 2016-01-15 LAB — URINE CULTURE: Organism ID, Bacteria: NO GROWTH

## 2016-01-26 ENCOUNTER — Telehealth: Payer: Self-pay

## 2016-01-26 NOTE — Telephone Encounter (Signed)
Patient stated she came in the other day and saw [facility], she was given antibiotics but they have not cleared up the infection and she wants to know what she needs to do now.  Her call back number is [phone]

## 2016-01-27 ENCOUNTER — Telehealth: Payer: Self-pay | Admitting: Emergency Medicine

## 2016-01-27 ENCOUNTER — Ambulatory Visit: Payer: BLUE CROSS/BLUE SHIELD

## 2016-01-27 NOTE — Telephone Encounter (Signed)
Ann Bird, Patient  States, after completing prescribed Macrobid, she noticed UTI sx's return. Bladder pressure, urine freq/urge with radiating lower back pain. Denies fever,chills,n,v,hematuria.   Do you want to prescribe Pyridium or do you want her to return to the clinic?  Please advise

## 2016-01-28 NOTE — Telephone Encounter (Signed)
Thank you. Please have her come back in this afternoon.  She should be evaluated by a provider, considering she has back pain.

## 2016-01-30 NOTE — Telephone Encounter (Signed)
Called pt back to check status after the weekend. Had to LM advising that if she is still having Sxs, espt back pain she should call to make appt to come back for re-eval. If her Sxs have improved, she does not need to CB.

## 2016-02-15 ENCOUNTER — Ambulatory Visit (INDEPENDENT_AMBULATORY_CARE_PROVIDER_SITE_OTHER): Payer: BLUE CROSS/BLUE SHIELD | Admitting: Family Medicine

## 2016-02-15 VITALS — BP 128/82 | HR 96 | Temp 98.3°F | Resp 17 | Ht 61.5 in | Wt 205.0 lb

## 2016-02-15 DIAGNOSIS — L209 Atopic dermatitis, unspecified: Secondary | ICD-10-CM

## 2016-02-15 DIAGNOSIS — Z131 Encounter for screening for diabetes mellitus: Secondary | ICD-10-CM | POA: Diagnosis not present

## 2016-02-15 DIAGNOSIS — R21 Rash and other nonspecific skin eruption: Secondary | ICD-10-CM

## 2016-02-15 LAB — GLUCOSE, POCT (MANUAL RESULT ENTRY): POC Glucose: 84 mg/dl (ref 70–99)

## 2016-02-15 MED ORDER — TRIAMCINOLONE ACETONIDE 0.1 % EX CREA: 1.0000 "application " | TOPICAL_CREAM | Freq: Two times a day (BID) | CUTANEOUS | 0 refills | Status: DC

## 2016-02-15 MED ORDER — PREDNISONE 20 MG PO TABS: 40.0000 mg | ORAL_TABLET | Freq: Every day | ORAL | 0 refills | Status: DC

## 2016-02-15 NOTE — Progress Notes (Signed)
By signing my name below I, Tereasa Coop, attest that this documentation has been prepared under the direction and in the presence of Wendie Agreste, MD. Electonically Signed. Tereasa Coop, Scribe 02/15/2016 at 5:30 PM   Subjective:    Patient ID: Ann Bird, female    DOB: [birth year], 45 y.o.   MRN: [id number]  Chief Complaint  Patient presents with  . Rash    face, around neck, hands     HPI Ann Bird is a 45 y.o. female who presents to the [facility] complaining of pruritic rash. Rash started on her face and neck and has started to spread to the dorsum of both hands. Rash first started a little more than a week ago. Denies rash on trunk, genital region, or legs. Rash started after she had tracks placed in her hair. Pt removed tracks after rash appeared.  Denies any new soaps, detergents, facial cleanser, jewelry. Pt used mud cleanser a week ago after the rash started and stated the cleanser burned and she took it off. Rash did not worsen after the mud cleanser was used. Pt also stopped using lotions with no change in rash.   Pt last seen on 01/13/16 for a UTI. Treated with Macrobid for 10 days.   Pt states the last time her blood sugar was checked was more than 2 months ago.   There are no active problems to display for this patient.  Past Medical History:  Diagnosis Date  . Fibroid, uterine    No past surgical history on file. No Known Allergies Prior to Admission medications   Medication Sig Start Date End Date Taking? Authorizing Provider  IRON, FERROUS SULFATE, PO Take 1 tablet by mouth. Every other day   Yes Historical Provider, MD  Multiple Vitamin (MULTIVITAMIN) tablet Take 1 tablet by mouth daily.   Yes Historical Provider, MD  nitrofurantoin, macrocrystal-monohydrate, (MACROBID) 100 MG capsule Take 1 capsule (100 mg total) by mouth 2 (two) times daily. Patient not taking: Reported on 02/15/2016 01/13/16   Gelene Mink McVey, PA-C    Social History   Social History  . Marital status: Divorced    Spouse name: N/A  . Number of children: N/A  . Years of education: N/A   Occupational History  . Not on file.   Social History Main Topics  . Smoking status: Never Smoker  . Smokeless tobacco: Never Used  . Alcohol use 0.0 oz/week     Comment: occasional. 1 glass of wine every other week  . Drug use: No  . Sexual activity: Yes    Birth control/ protection: None   Other Topics Concern  . Not on file   Social History Narrative  . No narrative on file      Review of Systems  Skin: Positive for rash.       Objective:   Physical Exam  Constitutional: She is oriented to person, place, and time. She appears well-developed and well-nourished. No distress.  HENT:  Head: Normocephalic and atraumatic.  Eyes: Conjunctivae are normal. Pupils are equal, round, and reactive to light.  Neck: Neck supple.  Cardiovascular: Normal rate.   Pulmonary/Chest: Effort normal.  Musculoskeletal: Normal range of motion.  Neurological: She is alert and oriented to person, place, and time.  Skin: Skin is warm and dry.  Pt has fine erythematous patches across her face and dorsal hands bilat. Pt has hyperpigmentation and slight dry skin on anterior neck. Upper chest, back, arms, legs,  interdigital areas of hands are clear of rash.  Psychiatric: She has a normal mood and affect. Her behavior is normal.  Nursing note and vitals reviewed.    Vitals:   02/15/16 1604  BP: 128/82  Pulse: 96  Resp: 17  Temp: 98.3 F (36.8 C)  TempSrc: Oral  SpO2: 99%  Weight: 205 lb (93 kg)  Height: 5' 1.5" (1.562 m)    Results for orders placed or performed in visit on 02/15/16  POCT glucose (manual entry)  Result Value Ref Range   POC Glucose 84 70 - 99 mg/dl        Assessment & Plan:     Ann Bird is a 45 y.o. female Screening for diabetes mellitus - Plan: POCT glucose (manual entry)  Rash of face - Plan: predniSONE  (DELTASONE) 20 MG tablet  Rash and nonspecific skin eruption - Plan: predniSONE (DELTASONE) 20 MG tablet, triamcinolone cream (KENALOG) 0.1 %  Atopic dermatitis, unspecified type - Plan: triamcinolone cream (KENALOG) 0.1 %  Suspected contact dermatitis versus component of atopic dermatitis/eczema, especially on dorsum of hands. Initially we'll try prednisone 40 mg daily 3 days due to involvement of face and neck. Then switch to triamcinolone 0.1% topical twice a day to affected areas as needed. Eucerin over-the-counter twice a day to dry/scaling areas. RTC precautions if persistent. Glucose okay (checked for prednisone use)  Meds ordered this encounter  Medications  . predniSONE (DELTASONE) 20 MG tablet    Sig: Take 2 tablets (40 mg total) by mouth daily with breakfast.    Dispense:  6 tablet    Refill:  0  . triamcinolone cream (KENALOG) 0.1 %    Sig: Apply 1 application topically 2 (two) times daily.    Dispense:  30 g    Refill:  0   Patient Instructions   Your rash could be a combination of contact dermatitis due to one of the dermatologic products or possible new detergent, as well as some component of eczema. Prednisone 40 mg per day for the next 3 days, then triamcinolone cream if needed for persistent itching areas up to twice per day. For face, neck  - use Cetaphil cleanser/moisturizer, for hands use Eucerin lotion - twice per day to affected areas. Remaining in the next 1 week to 10 days, or any worsening sooner, return for recheck.     Contact Dermatitis Introduction Dermatitis is redness, soreness, and swelling (inflammation) of the skin. Contact dermatitis is a reaction to certain substances that touch the skin. There are two types of contact dermatitis:  Irritant contact dermatitis. This type is caused by something that irritates your skin, such as dry hands from washing them too much. This type does not require previous exposure to the substance for a reaction to occur.  This type is more common.  Allergic contact dermatitis. This type is caused by a substance that you are allergic to, such as a nickel allergy or poison ivy. This type only occurs if you have been exposed to the substance (allergen) before. Upon a repeat exposure, your body reacts to the substance. This type is less common. What are the causes? Many different substances can cause contact dermatitis. Irritant contact dermatitis is most commonly caused by exposure to:  Makeup.  Soaps.  Detergents.  Bleaches.  Acids.  Metal salts, such as nickel. Allergic contact dermatitis is most commonly caused by exposure to:  Poisonous plants.  Chemicals.  Jewelry.  Latex.  Medicines.  Preservatives in products, such as  clothing. What increases the risk? This condition is more likely to develop in:  People who have jobs that expose them to irritants or allergens.  People who have certain medical conditions, such as asthma or eczema. What are the signs or symptoms? Symptoms of this condition may occur anywhere on your body where the irritant has touched you or is touched by you. Symptoms include:  Dryness or flaking.  Redness.  Cracks.  Itching.  Pain or a burning feeling.  Blisters.  Drainage of small amounts of blood or clear fluid from skin cracks. With allergic contact dermatitis, there may also be swelling in areas such as the eyelids, mouth, or genitals. How is this diagnosed? This condition is diagnosed with a medical history and physical exam. A patch skin test may be performed to help determine the cause. If the condition is related to your job, you may need to see an occupational medicine specialist. How is this treated? Treatment for this condition includes figuring out what caused the reaction and protecting your skin from further contact. Treatment may also include:  Steroid creams or ointments. Oral steroid medicines may be needed in more severe  cases.  Antibiotics or antibacterial ointments, if a skin infection is present.  Antihistamine lotion or an antihistamine taken by mouth to ease itching.  A bandage (dressing). Follow these instructions at home: [place] your skin as needed.  Apply cool compresses to the affected areas.  Try taking a bath with:  Epsom salts. Follow the instructions on the packaging. You can get these at your local pharmacy or grocery store.  Baking soda. Pour a small amount into the bath as directed by your health care provider.  Colloidal oatmeal. Follow the instructions on the packaging. You can get this at your local pharmacy or grocery store.  Try applying baking soda paste to your skin. Stir water into baking soda until it reaches a paste-like consistency.  Do not scratch your skin.  Bathe less frequently, such as every other day.  Bathe in lukewarm water. Avoid using hot water. Medicines  Take or apply over-the-counter and prescription medicines only as told by your health care provider.  If you were prescribed an antibiotic medicine, take or apply your antibiotic as told by your health care provider. Do not stop using the antibiotic even if your condition starts to improve. General instructions  Keep all follow-up visits as told by your health care provider. This is important.  Avoid the substance that caused your reaction. If you do not know what caused it, keep a journal to try to track what caused it. Write down:  What you eat.  What cosmetic products you use.  What you drink.  What you wear in the affected area. This includes jewelry.  If you were given a dressing, take care of it as told by your health care provider. This includes when to change and remove it. Contact a health care provider if:  Your condition does not improve with treatment.  Your condition gets worse.  You have signs of infection such as swelling, tenderness, redness, soreness, or  warmth in the affected area.  You have a fever.  You have new symptoms. Get help right away if:  You have a severe headache, neck pain, or neck stiffness.  You vomit.  You feel very sleepy.  You notice red streaks coming from the affected area.  Your bone or joint underneath the affected area becomes painful after the skin has healed.  The affected area turns darker.  You have difficulty breathing. This information is not intended to replace advice given to you by your health care provider. Make sure you discuss any questions you have with your health care provider. Document Released: 02/24/2000 Document Revised: 08/04/2015 Document Reviewed: 07/14/2014  2017 Elsevier  Atopic Dermatitis Atopic dermatitis is a skin disorder that causes inflammation of the skin. This is the most common type of eczema. Eczema is a group of skin conditions that cause the skin to be itchy, red, and swollen. This condition is generally worse during the cooler winter months and often improves during the warm summer months. Symptoms can vary from person to person. Atopic dermatitis usually starts showing signs in infancy and can last through adulthood. This condition cannot be passed from one person to another (non-contagious), but is more common in families. Atopic dermatitis may not always be present. When it is present, it is called a flare-up. What are the causes? The exact cause of this condition is not known. Flare-ups of the condition may be triggered by:  Contact with something you are sensitive or allergic to.  Stress.  Certain foods.  Extremely hot or cold weather.  Harsh chemicals and soaps.  Dry air.  Chlorine. What increases the risk? This condition is more likely to develop in people who have a personal history or family history of eczema, allergies, asthma, or hay fever. What are the signs or symptoms? Symptoms of this condition include:  Dry, scaly skin.  Red, itchy  rash.  Itchiness, which can be severe. This may occur before the skin rash. This can make sleeping difficult.  Skin thickening and cracking can occur over time. How is this diagnosed? This condition is diagnosed based on your symptoms, a medical history, and a physical exam. How is this treated? There is no cure for this condition, but symptoms can usually be controlled. Treatment focuses on:  Controlling the itching and scratching. You may be given medicines, such as antihistamines or steroid creams.  Limiting exposure to things that you are sensitive or allergic to (allergens).  Recognizing situations that cause stress and developing a plan to manage stress. If your atopic dermatitis does not get better with medicines or is all over your body (widespread) , a treatment using a specific type of light (phototherapy) may be used. Follow these instructions at home: Skin care  Keep your skin well-moisturized. This seals in moisture and help prevent dryness.  Use unscented lotions that have petroleum in them.  Avoid lotions that contain alcohol and water. They can dry the skin.  Keep baths or showers short (less than 5 minutes) in warm water. Do not use hot water.  Use mild, unscented cleansers for bathing. Avoid soap and bubble bath.  Apply a moisturizer to your skin right after a bath or shower.   Do not apply anything to your skin without checking with your health care provider. General instructions  Dress in clothes made of cotton or cotton blends. Dress lightly because heat increases itching.  When washing your clothes, rinse your clothes twice so all of the soap is removed.  Avoid any triggers that can cause a flare-up.  Try to manage your stress.  Keep your fingernails cut short.  Avoid scratching. Scratching makes the rash and itching worse. It may also result in a skin infection (impetigo) due to a break in the skin caused by scratching.  Take or apply  over-the-counter and prescription medicines only as told by your  health care provider.  Keep all follow-up visits as told by your health care provider. This is important.  Do not be around people who have cold sores or fever blisters. If you get the infection, it may cause your atopic dermatitis to worsen. Contact a health care provider if:  Your itching interferes with sleep.  Your rash gets worse or is not better within one week of starting treatment.  You have a fever.  You have a rash flare-up after having contact with someone who has cold sores or fever blisters. Get help right away if:  You develop pus or soft yellow scabs in the rash area. Summary  This condition causes a red rash and itchy, dry, scaly skin.  Treatment focuses on controlling the itching and scratching, limiting exposure to things that you are sensitive or allergic to (allergens), and recognizing situations that cause stress and developing a plan to manage stress.  Keep your skin well-moisturized.  Keep baths or showers less than 5 minutes. This information is not intended to replace advice given to you by your health care provider. Make sure you discuss any questions you have with your health care provider. Document Released: 02/24/2000 Document Revised: 08/04/2015 Document Reviewed: 09/29/2012 Elsevier Interactive Patient Education  2017 [employer].    IF you received an x-ray today, you will receive an invoice from [facility]. Please contact [facility] at [phone] with questions or concerns regarding your invoice.   IF you received labwork today, you will receive an invoice from [employer]. Please contact [facility] at [phone] with questions or concerns regarding your invoice.   Our billing staff will not be able to assist you with questions regarding bills from these companies.  You will be contacted with the lab results as soon as they are  available. The fastest way to get your results is to activate your My Chart account. Instructions are located on the last page of this paperwork. If you have not heard from [place] regarding the results in 2 weeks, please contact this office.        I personally performed the services described in this documentation, which was scribed in my presence. The recorded information has been reviewed and considered, and addended by me as needed.   Signed,   Merri Ray, MD [facility].  02/17/16 1:36 PM

## 2016-02-15 NOTE — Patient Instructions (Addendum)
Your rash could be a combination of contact dermatitis due to one of the dermatologic products or possible new detergent, as well as some component of eczema. Prednisone 40 mg per day for the next 3 days, then triamcinolone cream if needed for persistent itching areas up to twice per day. For face, neck  - use Cetaphil cleanser/moisturizer, for hands use Eucerin lotion - twice per day to affected areas. Remaining in the next 1 week to 10 days, or any worsening sooner, return for recheck.     Contact Dermatitis Introduction Dermatitis is redness, soreness, and swelling (inflammation) of the skin. Contact dermatitis is a reaction to certain substances that touch the skin. There are two types of contact dermatitis:  Irritant contact dermatitis. This type is caused by something that irritates your skin, such as dry hands from washing them too much. This type does not require previous exposure to the substance for a reaction to occur. This type is more common.  Allergic contact dermatitis. This type is caused by a substance that you are allergic to, such as a nickel allergy or poison ivy. This type only occurs if you have been exposed to the substance (allergen) before. Upon a repeat exposure, your body reacts to the substance. This type is less common. What are the causes? Many different substances can cause contact dermatitis. Irritant contact dermatitis is most commonly caused by exposure to:  Makeup.  Soaps.  Detergents.  Bleaches.  Acids.  Metal salts, such as nickel. Allergic contact dermatitis is most commonly caused by exposure to:  Poisonous plants.  Chemicals.  Jewelry.  Latex.  Medicines.  Preservatives in products, such as clothing. What increases the risk? This condition is more likely to develop in:  People who have jobs that expose them to irritants or allergens.  People who have certain medical conditions, such as asthma or eczema. What are the signs or  symptoms? Symptoms of this condition may occur anywhere on your body where the irritant has touched you or is touched by you. Symptoms include:  Dryness or flaking.  Redness.  Cracks.  Itching.  Pain or a burning feeling.  Blisters.  Drainage of small amounts of blood or clear fluid from skin cracks. With allergic contact dermatitis, there may also be swelling in areas such as the eyelids, mouth, or genitals. How is this diagnosed? This condition is diagnosed with a medical history and physical exam. A patch skin test may be performed to help determine the cause. If the condition is related to your job, you may need to see an occupational medicine specialist. How is this treated? Treatment for this condition includes figuring out what caused the reaction and protecting your skin from further contact. Treatment may also include:  Steroid creams or ointments. Oral steroid medicines may be needed in more severe cases.  Antibiotics or antibacterial ointments, if a skin infection is present.  Antihistamine lotion or an antihistamine taken by mouth to ease itching.  A bandage (dressing). Follow these instructions at home: [place] your skin as needed.  Apply cool compresses to the affected areas.  Try taking a bath with:  Epsom salts. Follow the instructions on the packaging. You can get these at your local pharmacy or grocery store.  Baking soda. Pour a small amount into the bath as directed by your health care provider.  Colloidal oatmeal. Follow the instructions on the packaging. You can get this at your local pharmacy or grocery store.  Try applying baking soda  paste to your skin. Stir water into baking soda until it reaches a paste-like consistency.  Do not scratch your skin.  Bathe less frequently, such as every other day.  Bathe in lukewarm water. Avoid using hot water. Medicines  Take or apply over-the-counter and prescription medicines only as told  by your health care provider.  If you were prescribed an antibiotic medicine, take or apply your antibiotic as told by your health care provider. Do not stop using the antibiotic even if your condition starts to improve. General instructions  Keep all follow-up visits as told by your health care provider. This is important.  Avoid the substance that caused your reaction. If you do not know what caused it, keep a journal to try to track what caused it. Write down:  What you eat.  What cosmetic products you use.  What you drink.  What you wear in the affected area. This includes jewelry.  If you were given a dressing, take care of it as told by your health care provider. This includes when to change and remove it. Contact a health care provider if:  Your condition does not improve with treatment.  Your condition gets worse.  You have signs of infection such as swelling, tenderness, redness, soreness, or warmth in the affected area.  You have a fever.  You have new symptoms. Get help right away if:  You have a severe headache, neck pain, or neck stiffness.  You vomit.  You feel very sleepy.  You notice red streaks coming from the affected area.  Your bone or joint underneath the affected area becomes painful after the skin has healed.  The affected area turns darker.  You have difficulty breathing. This information is not intended to replace advice given to you by your health care provider. Make sure you discuss any questions you have with your health care provider. Document Released: 02/24/2000 Document Revised: 08/04/2015 Document Reviewed: 07/14/2014  2017 Elsevier  Atopic Dermatitis Atopic dermatitis is a skin disorder that causes inflammation of the skin. This is the most common type of eczema. Eczema is a group of skin conditions that cause the skin to be itchy, red, and swollen. This condition is generally worse during the cooler winter months and often improves  during the warm summer months. Symptoms can vary from person to person. Atopic dermatitis usually starts showing signs in infancy and can last through adulthood. This condition cannot be passed from one person to another (non-contagious), but is more common in families. Atopic dermatitis may not always be present. When it is present, it is called a flare-up. What are the causes? The exact cause of this condition is not known. Flare-ups of the condition may be triggered by:  Contact with something you are sensitive or allergic to.  Stress.  Certain foods.  Extremely hot or cold weather.  Harsh chemicals and soaps.  Dry air.  Chlorine. What increases the risk? This condition is more likely to develop in people who have a personal history or family history of eczema, allergies, asthma, or hay fever. What are the signs or symptoms? Symptoms of this condition include:  Dry, scaly skin.  Red, itchy rash.  Itchiness, which can be severe. This may occur before the skin rash. This can make sleeping difficult.  Skin thickening and cracking can occur over time. How is this diagnosed? This condition is diagnosed based on your symptoms, a medical history, and a physical exam. How is this treated? There is no cure  for this condition, but symptoms can usually be controlled. Treatment focuses on:  Controlling the itching and scratching. You may be given medicines, such as antihistamines or steroid creams.  Limiting exposure to things that you are sensitive or allergic to (allergens).  Recognizing situations that cause stress and developing a plan to manage stress. If your atopic dermatitis does not get better with medicines or is all over your body (widespread) , a treatment using a specific type of light (phototherapy) may be used. Follow these instructions at home: Skin care  Keep your skin well-moisturized. This seals in moisture and help prevent dryness.  Use unscented lotions that  have petroleum in them.  Avoid lotions that contain alcohol and water. They can dry the skin.  Keep baths or showers short (less than 5 minutes) in warm water. Do not use hot water.  Use mild, unscented cleansers for bathing. Avoid soap and bubble bath.  Apply a moisturizer to your skin right after a bath or shower.   Do not apply anything to your skin without checking with your health care provider. General instructions  Dress in clothes made of cotton or cotton blends. Dress lightly because heat increases itching.  When washing your clothes, rinse your clothes twice so all of the soap is removed.  Avoid any triggers that can cause a flare-up.  Try to manage your stress.  Keep your fingernails cut short.  Avoid scratching. Scratching makes the rash and itching worse. It may also result in a skin infection (impetigo) due to a break in the skin caused by scratching.  Take or apply over-the-counter and prescription medicines only as told by your health care provider.  Keep all follow-up visits as told by your health care provider. This is important.  Do not be around people who have cold sores or fever blisters. If you get the infection, it may cause your atopic dermatitis to worsen. Contact a health care provider if:  Your itching interferes with sleep.  Your rash gets worse or is not better within one week of starting treatment.  You have a fever.  You have a rash flare-up after having contact with someone who has cold sores or fever blisters. Get help right away if:  You develop pus or soft yellow scabs in the rash area. Summary  This condition causes a red rash and itchy, dry, scaly skin.  Treatment focuses on controlling the itching and scratching, limiting exposure to things that you are sensitive or allergic to (allergens), and recognizing situations that cause stress and developing a plan to manage stress.  Keep your skin well-moisturized.  Keep baths or  showers less than 5 minutes. This information is not intended to replace advice given to you by your health care provider. Make sure you discuss any questions you have with your health care provider. Document Released: 02/24/2000 Document Revised: 08/04/2015 Document Reviewed: 09/29/2012 Elsevier Interactive Patient Education  2017 [employer].    IF you received an x-ray today, you will receive an invoice from [facility]. Please contact [facility] at [phone] with questions or concerns regarding your invoice.   IF you received labwork today, you will receive an invoice from [employer]. Please contact [facility] at [phone] with questions or concerns regarding your invoice.   Our billing staff will not be able to assist you with questions regarding bills from these companies.  You will be contacted with the lab results as soon as they are available. The fastest way to  get your results is to activate your My Chart account. Instructions are located on the last page of this paperwork. If you have not heard from [place] regarding the results in 2 weeks, please contact this office.

## 2016-02-27 ENCOUNTER — Ambulatory Visit (INDEPENDENT_AMBULATORY_CARE_PROVIDER_SITE_OTHER): Payer: BLUE CROSS/BLUE SHIELD | Admitting: Family Medicine

## 2016-02-27 VITALS — BP 108/70 | HR 99 | Temp 98.4°F | Resp 16 | Ht 61.5 in | Wt 201.0 lb

## 2016-02-27 DIAGNOSIS — L259 Unspecified contact dermatitis, unspecified cause: Secondary | ICD-10-CM | POA: Diagnosis not present

## 2016-02-27 DIAGNOSIS — R21 Rash and other nonspecific skin eruption: Secondary | ICD-10-CM

## 2016-02-27 DIAGNOSIS — L209 Atopic dermatitis, unspecified: Secondary | ICD-10-CM

## 2016-02-27 MED ORDER — PREDNISONE 20 MG PO TABS: 40.0000 mg | ORAL_TABLET | Freq: Every day | ORAL | 0 refills | Status: DC

## 2016-02-27 NOTE — Patient Instructions (Addendum)
As the rash is improving, okay to continue the triamcinolone cream to affected areas up to twice per day. If mild symptoms, you can decrease intensity of steroid cream by using over-the-counter hydrocortisone instead. I will refer you to dermatology in case this rash does not continue to improve or it returns. If it does worsen while using the steroid cream, fill the printed prescription for prednisone again. If spread of rash to other areas, fevers, or other worsening symptoms, return for recheck.  Continue the Eucerin lotion and steroid cream to the top of the hand as needed.    Contact Dermatitis Introduction Dermatitis is redness, soreness, and swelling (inflammation) of the skin. Contact dermatitis is a reaction to certain substances that touch the skin. There are two types of contact dermatitis:  Irritant contact dermatitis. This type is caused by something that irritates your skin, such as dry hands from washing them too much. This type does not require previous exposure to the substance for a reaction to occur. This type is more common.  Allergic contact dermatitis. This type is caused by a substance that you are allergic to, such as a nickel allergy or poison ivy. This type only occurs if you have been exposed to the substance (allergen) before. Upon a repeat exposure, your body reacts to the substance. This type is less common. What are the causes? Many different substances can cause contact dermatitis. Irritant contact dermatitis is most commonly caused by exposure to:  Makeup.  Soaps.  Detergents.  Bleaches.  Acids.  Metal salts, such as nickel. Allergic contact dermatitis is most commonly caused by exposure to:  Poisonous plants.  Chemicals.  Jewelry.  Latex.  Medicines.  Preservatives in products, such as clothing. What increases the risk? This condition is more likely to develop in:  People who have jobs that expose them to irritants or allergens.  People  who have certain medical conditions, such as asthma or eczema. What are the signs or symptoms? Symptoms of this condition may occur anywhere on your body where the irritant has touched you or is touched by you. Symptoms include:  Dryness or flaking.  Redness.  Cracks.  Itching.  Pain or a burning feeling.  Blisters.  Drainage of small amounts of blood or clear fluid from skin cracks. With allergic contact dermatitis, there may also be swelling in areas such as the eyelids, mouth, or genitals. How is this diagnosed? This condition is diagnosed with a medical history and physical exam. A patch skin test may be performed to help determine the cause. If the condition is related to your job, you may need to see an occupational medicine specialist. How is this treated? Treatment for this condition includes figuring out what caused the reaction and protecting your skin from further contact. Treatment may also include:  Steroid creams or ointments. Oral steroid medicines may be needed in more severe cases.  Antibiotics or antibacterial ointments, if a skin infection is present.  Antihistamine lotion or an antihistamine taken by mouth to ease itching.  A bandage (dressing). Follow these instructions at home: [place] your skin as needed.  Apply cool compresses to the affected areas.  Try taking a bath with:  Epsom salts. Follow the instructions on the packaging. You can get these at your local pharmacy or grocery store.  Baking soda. Pour a small amount into the bath as directed by your health care provider.  Colloidal oatmeal. Follow the instructions on the packaging. You can get this at  your local pharmacy or grocery store.  Try applying baking soda paste to your skin. Stir water into baking soda until it reaches a paste-like consistency.  Do not scratch your skin.  Bathe less frequently, such as every other day.  Bathe in lukewarm water. Avoid using hot  water. Medicines  Take or apply over-the-counter and prescription medicines only as told by your health care provider.  If you were prescribed an antibiotic medicine, take or apply your antibiotic as told by your health care provider. Do not stop using the antibiotic even if your condition starts to improve. General instructions  Keep all follow-up visits as told by your health care provider. This is important.  Avoid the substance that caused your reaction. If you do not know what caused it, keep a journal to try to track what caused it. Write down:  What you eat.  What cosmetic products you use.  What you drink.  What you wear in the affected area. This includes jewelry.  If you were given a dressing, take care of it as told by your health care provider. This includes when to change and remove it. Contact a health care provider if:  Your condition does not improve with treatment.  Your condition gets worse.  You have signs of infection such as swelling, tenderness, redness, soreness, or warmth in the affected area.  You have a fever.  You have new symptoms. Get help right away if:  You have a severe headache, neck pain, or neck stiffness.  You vomit.  You feel very sleepy.  You notice red streaks coming from the affected area.  Your bone or joint underneath the affected area becomes painful after the skin has healed.  The affected area turns darker.  You have difficulty breathing. This information is not intended to replace advice given to you by your health care provider. Make sure you discuss any questions you have with your health care provider. Document Released: 02/24/2000 Document Revised: 08/04/2015 Document Reviewed: 07/14/2014  2017 Elsevier   IF you received an x-ray today, you will receive an invoice from [facility]. Please contact [facility] at [phone] with questions or concerns regarding your invoice.   IF you received  labwork today, you will receive an invoice from [place]. Please contact [facility] at [phone] with questions or concerns regarding your invoice.   Our billing staff will not be able to assist you with questions regarding bills from these companies.  You will be contacted with the lab results as soon as they are available. The fastest way to get your results is to activate your My Chart account. Instructions are located on the last page of this paperwork. If you have not heard from [place] regarding the results in 2 weeks, please contact this office.

## 2016-02-27 NOTE — Progress Notes (Signed)
Subjective:  This chart was scribed for Ann Ray MD, by Tamsen Roers, at [facility].  This patient was seen in room 5 and the patient's care was started at 10:59 AM.   Chief Complaint  Patient presents with  . Referral    To dermatology for facial rash     Patient ID: Ann Bird, female    DOB: 11-18-1970, 45 y.o.   MRN: [id number]  HPI HPI Comments: Ann Bird is a 45 y.o. female who presents to the [facility] for a  follow up of a rash on her face. She was last seen on December 6th with a rash on her face and neck that spread to both hands. Thought to be possible contact dermatitis versus component of eczema especially on hands. She was treated with prednisone 40 mg QD X 3 days then topical triamcinolone. She was also advised to try Eucerin to dry areas. --- She was complaint with creme and prednisone after her last visit which seemed to alleviate her rash. She changed her soap to [place], changed her detergent and any rags that she was using at home.  Three days ago, she noticed little bumps with white heads on her chin which spread to her jaw line. She denies rashes in other locations on her body.  She is compliant with her Eucerin and steroid creme and will continue to use them.  Denies any side effects from the prednisone. Denies fever, shortness of breath, mouth lesions or groin lesions.      There are no active problems to display for this patient.  Past Medical History:  Diagnosis Date  . Fibroid, uterine    No past surgical history on file. No Known Allergies Prior to Admission medications   Medication Sig Start Date End Date Taking? Authorizing Provider  IRON, FERROUS SULFATE, PO Take 1 tablet by mouth. Every other day   Yes Historical Provider, MD  Multiple Vitamin (MULTIVITAMIN) tablet Take 1 tablet by mouth daily.   Yes Historical Provider, MD  triamcinolone cream (KENALOG) 0.1 % Apply 1 application  topically 2 (two) times daily. 02/15/16  Yes Wendie Agreste, MD  nitrofurantoin, macrocrystal-monohydrate, (MACROBID) 100 MG capsule Take 1 capsule (100 mg total) by mouth 2 (two) times daily. Patient not taking: Reported on 02/27/2016 01/13/16   Gelene Mink McVey, PA-C   Social History   Social History  . Marital status: Divorced    Spouse name: N/A  . Number of children: N/A  . Years of education: N/A   Occupational History  . Not on file.   Social History Main Topics  . Smoking status: Never Smoker  . Smokeless tobacco: Never Used  . Alcohol use 0.0 oz/week     Comment: occasional. 1 glass of wine every other week  . Drug use: No  . Sexual activity: Yes    Birth control/ protection: None   Other Topics Concern  . Not on file   Social History Narrative  . No narrative on file    Review of Systems  Constitutional: Negative for chills and fever.  Respiratory: Negative for cough, choking and shortness of breath.   Gastrointestinal: Negative for nausea and vomiting.  Skin: Positive for rash.  Neurological: Negative for syncope and speech difficulty.       Objective:   Physical Exam  Constitutional: She appears well-developed and well-nourished. No distress.  HENT:  Head: Normocephalic and atraumatic.  Neck: Neck supple.  No  lymphadenopathy.   Pulmonary/Chest: Effort normal. No respiratory distress.  Neurological: She is alert.  Skin: She is not diaphoretic.  erythematous rash with slight excoriation on the bilateral jaw line right greater than left as well as in the chin and slightly into submental area. Few scattered areas of erythema on the anterior neck. No pustules present. Few small areas of erythema below the nasal ala as well as just above the upper lip.  Hands: some slight hyperpigmentation on the dorsum of her left greater than right wrist, but no open lesions.   Psychiatric: She has a normal mood and affect. Her behavior is normal.  Nursing note and  vitals reviewed.  Vitals:   02/27/16 1019  BP: 108/70  Pulse: 99  Resp: 16  Temp: 98.4 F (36.9 C)  TempSrc: Oral  SpO2: 99%  Weight: 201 lb (91.2 kg)  Height: 5' 1.5" (1.562 m)        Assessment & Plan:    Ann Bird is a 45 y.o. female Contact dermatitis, unspecified contact dermatitis type, unspecified trigger - Plan: Ambulatory referral to Dermatology  Atopic dermatitis, unspecified type - Plan: Ambulatory referral to Dermatology  Rash of face - Plan: predniSONE (DELTASONE) 20 MG tablet, Ambulatory referral to Dermatology  Rash and nonspecific skin eruption - Plan: predniSONE (DELTASONE) 20 MG tablet  Still appears to be some component of contact dermatitis to face, and atopic dermatitis of hands. Hands have improved, face did improve/resolve, then returned. Currently is improving with topical triamcinolone, but discussed possible prednisone if worsening (printed), and will refer to dermatology. Can also use hydrocortisone over-the-counter if continuing to improve for mild  treatment as we discussed risks of hypo/hyperpigmentation with steroid use. RTC precautions given.    Meds ordered this encounter  Medications  . predniSONE (DELTASONE) 20 MG tablet    Sig: Take 2 tablets (40 mg total) by mouth daily with breakfast.    Dispense:  6 tablet    Refill:  0   Patient Instructions    As the rash is improving, okay to continue the triamcinolone cream to affected areas up to twice per day. If mild symptoms, you can decrease intensity of steroid cream by using over-the-counter hydrocortisone instead. I will refer you to dermatology in case this rash does not continue to improve or it returns. If it does worsen while using the steroid cream, fill the printed prescription for prednisone again. If spread of rash to other areas, fevers, or other worsening symptoms, return for recheck.  Continue the Eucerin lotion and steroid cream to the top of the hand as  needed.    Contact Dermatitis Introduction Dermatitis is redness, soreness, and swelling (inflammation) of the skin. Contact dermatitis is a reaction to certain substances that touch the skin. There are two types of contact dermatitis:  Irritant contact dermatitis. This type is caused by something that irritates your skin, such as dry hands from washing them too much. This type does not require previous exposure to the substance for a reaction to occur. This type is more common.  Allergic contact dermatitis. This type is caused by a substance that you are allergic to, such as a nickel allergy or poison ivy. This type only occurs if you have been exposed to the substance (allergen) before. Upon a repeat exposure, your body reacts to the substance. This type is less common. What are the causes? Many different substances can cause contact dermatitis. Irritant contact dermatitis is most commonly caused by exposure to:  Makeup.  Soaps.  Detergents.  Bleaches.  Acids.  Metal salts, such as nickel. Allergic contact dermatitis is most commonly caused by exposure to:  Poisonous plants.  Chemicals.  Jewelry.  Latex.  Medicines.  Preservatives in products, such as clothing. What increases the risk? This condition is more likely to develop in:  People who have jobs that expose them to irritants or allergens.  People who have certain medical conditions, such as asthma or eczema. What are the signs or symptoms? Symptoms of this condition may occur anywhere on your body where the irritant has touched you or is touched by you. Symptoms include:  Dryness or flaking.  Redness.  Cracks.  Itching.  Pain or a burning feeling.  Blisters.  Drainage of small amounts of blood or clear fluid from skin cracks. With allergic contact dermatitis, there may also be swelling in areas such as the eyelids, mouth, or genitals. How is this diagnosed? This condition is diagnosed with a  medical history and physical exam. A patch skin test may be performed to help determine the cause. If the condition is related to your job, you may need to see an occupational medicine specialist. How is this treated? Treatment for this condition includes figuring out what caused the reaction and protecting your skin from further contact. Treatment may also include:  Steroid creams or ointments. Oral steroid medicines may be needed in more severe cases.  Antibiotics or antibacterial ointments, if a skin infection is present.  Antihistamine lotion or an antihistamine taken by mouth to ease itching.  A bandage (dressing). Follow these instructions at home: Garland your skin as needed.  Apply cool compresses to the affected areas.  Try taking a bath with:  Epsom salts. Follow the instructions on the packaging. You can get these at your local pharmacy or grocery store.  Baking soda. Pour a small amount into the bath as directed by your health care provider.  Colloidal oatmeal. Follow the instructions on the packaging. You can get this at your local pharmacy or grocery store.  Try applying baking soda paste to your skin. Stir water into baking soda until it reaches a paste-like consistency.  Do not scratch your skin.  Bathe less frequently, such as every other day.  Bathe in lukewarm water. Avoid using hot water. Medicines  Take or apply over-the-counter and prescription medicines only as told by your health care provider.  If you were prescribed an antibiotic medicine, take or apply your antibiotic as told by your health care provider. Do not stop using the antibiotic even if your condition starts to improve. General instructions  Keep all follow-up visits as told by your health care provider. This is important.  Avoid the substance that caused your reaction. If you do not know what caused it, keep a journal to try to track what caused it. Write down:  What you  eat.  What cosmetic products you use.  What you drink.  What you wear in the affected area. This includes jewelry.  If you were given a dressing, take care of it as told by your health care provider. This includes when to change and remove it. Contact a health care provider if:  Your condition does not improve with treatment.  Your condition gets worse.  You have signs of infection such as swelling, tenderness, redness, soreness, or warmth in the affected area.  You have a fever.  You have new symptoms. Get help right away if:  You have  a severe headache, neck pain, or neck stiffness.  You vomit.  You feel very sleepy.  You notice red streaks coming from the affected area.  Your bone or joint underneath the affected area becomes painful after the skin has healed.  The affected area turns darker.  You have difficulty breathing. This information is not intended to replace advice given to you by your health care provider. Make sure you discuss any questions you have with your health care provider. Document Released: 02/24/2000 Document Revised: 08/04/2015 Document Reviewed: 07/14/2014  2017 [employer]vier   IF you received an x-Bird today, you will receive an invoice from [facility]. Please contact [facility] at [phone] with questions or concerns regarding your invoice.   IF you received labwork today, you will receive an invoice from [place]. Please contact [facility] at [phone] with questions or concerns regarding your invoice.   Our billing staff will not be able to assist you with questions regarding bills from these companies.  You will be contacted with the lab results as soon as they are available. The fastest way to get your results is to activate your My Chart account. Instructions are located on the last page of this paperwork. If you have not heard from [place] regarding the results in 2 weeks, please contact this office.       I  personally performed the services described in this documentation, which was scribed in my presence. The recorded information has been reviewed and considered, and addended by me as needed.   Signed,   Ann Ray, MD [facility].  02/27/16 11:12 AM

## 2016-11-19 ENCOUNTER — Ambulatory Visit (INDEPENDENT_AMBULATORY_CARE_PROVIDER_SITE_OTHER): Payer: BLUE CROSS/BLUE SHIELD | Admitting: Family Medicine

## 2016-11-19 ENCOUNTER — Encounter: Payer: Self-pay | Admitting: Family Medicine

## 2016-11-19 VITALS — BP 118/79 | HR 96 | Temp 98.4°F | Resp 16 | Ht 61.81 in | Wt 203.0 lb

## 2016-11-19 DIAGNOSIS — N3000 Acute cystitis without hematuria: Secondary | ICD-10-CM

## 2016-11-19 DIAGNOSIS — R350 Frequency of micturition: Secondary | ICD-10-CM | POA: Diagnosis not present

## 2016-11-19 DIAGNOSIS — M545 Low back pain, unspecified: Secondary | ICD-10-CM

## 2016-11-19 LAB — POCT URINALYSIS DIP (MANUAL ENTRY)
BILIRUBIN UA: NEGATIVE
BILIRUBIN UA: NEGATIVE mg/dL
Glucose, UA: NEGATIVE mg/dL
Nitrite, UA: NEGATIVE
PH UA: 8 (ref 5.0–8.0)
Protein Ur, POC: 100 mg/dL — AB
RBC UA: NEGATIVE
SPEC GRAV UA: 1.015 (ref 1.010–1.025)
Urobilinogen, UA: 0.2 E.U./dL

## 2016-11-19 LAB — POC MICROSCOPIC URINALYSIS (UMFC): Mucus: ABSENT

## 2016-11-19 MED ORDER — SULFAMETHOXAZOLE-TRIMETHOPRIM 800-160 MG PO TABS: 1.0000 | ORAL_TABLET | Freq: Two times a day (BID) | ORAL | 0 refills | Status: DC

## 2016-11-19 NOTE — Progress Notes (Signed)
Subjective:    Patient ID: Ann Bird, female    DOB: May 28, 1970, 46 y.o.   MRN: [id number]  HPI   Ann Bird is a 46 y.o. female Presents today for: Chief Complaint  Patient presents with  . Urinary Frequency    pt states she has also had pressure with urination x 2 weeks  . Back Pain    lower back x 2 weeks    Started about 2 weeks ago with urinary frequency, urgency. No dysuria. No hematuria. No fever, min nausea, no vomiting. Persistent symptoms for 2 weeks, but some more lower abd pain this past week. Did have some lower back pain past 2 weeks. Prior stiff back with CNA work.   Tx: AZO otc - some improvement.   No bowel or bladder incontinence, no saddle anesthesia, no lower extremity weakness.    There are no active problems to display for this patient.  Past Medical History:  Diagnosis Date  . Fibroid, uterine    History reviewed. No pertinent surgical history. No Known Allergies Prior to Admission medications   Medication Sig Start Date End Date Taking? Authorizing Provider  IRON, FERROUS SULFATE, PO Take 1 tablet by mouth. Every other day   Yes [provider]  Multiple Vitamin (MULTIVITAMIN) tablet Take 1 tablet by mouth daily.   Yes [provider]  triamcinolone cream (KENALOG) 0.1 % Apply 1 application topically 2 (two) times daily. 02/15/16  Yes Wendie Agreste, MD   Social History   Social History  . Marital status: Divorced    Spouse name: N/A  . Number of children: N/A  . Years of education: N/A   Occupational History  . Not on file.   Social History Main Topics  . Smoking status: Never Smoker  . Smokeless tobacco: Never Used  . Alcohol use 0.0 oz/week     Comment: occasional. 1 glass of wine every other week  . Drug use: No  . Sexual activity: Yes    Birth control/ protection: None   Other Topics Concern  . Not on file   Social History Narrative  . No narrative on file    Review of Systems    Gastrointestinal: Positive for abdominal pain and nausea. Negative for vomiting.  Genitourinary: Positive for frequency and urgency. Negative for difficulty urinating, hematuria and pelvic pain.  Musculoskeletal: Positive for back pain and myalgias.  Skin: Negative for rash.  Neurological: Negative for weakness.       Objective:   Physical Exam  Constitutional: She is oriented to person, place, and time. She appears well-developed and well-nourished.  HENT:  Head: Normocephalic and atraumatic.  Eyes: Pupils are equal, round, and reactive to light. Conjunctivae and EOM are normal.  Neck: Carotid bruit is not present.  Cardiovascular: Normal rate, regular rhythm, normal heart sounds and intact distal pulses.   Pulmonary/Chest: Effort normal and breath sounds normal.  Abdominal: Soft. She exhibits no distension and no pulsatile midline mass. There is tenderness (Minimal suprapubic. No rebound/guarding) in the suprapubic area. There is no rebound, no guarding, no CVA tenderness, no tenderness at McBurney's point and negative Murphy's sign.  Musculoskeletal:       Lumbar back: She exhibits tenderness (Minimal lower paraspinal, no CVA tenderness). She exhibits normal range of motion (Normal range of motion, does reproduce some discomfort with extension.) and no bony tenderness.  Neurological: She is alert and oriented to person, place, and time.  Skin: Skin is warm and dry.  Psychiatric:  She has a normal mood and affect. Her behavior is normal.  Vitals reviewed.  Vitals:   11/19/16 1152  BP: 118/79  Pulse: 96  Resp: 16  Temp: 98.4 F (36.9 C)  TempSrc: Oral  SpO2: 99%  Weight: 203 lb (92.1 kg)  Height: 5' 1.81" (1.57 m)    Results for orders placed or performed in visit on 11/19/16  POCT urinalysis dipstick  Result Value Ref Range   Color, UA yellow yellow   Clarity, UA clear clear   Glucose, UA negative negative mg/dL   Bilirubin, UA negative negative   Ketones, POC UA  negative negative mg/dL   Spec Grav, UA 1.015 1.010 - 1.025   Blood, UA negative negative   pH, UA 8.0 5.0 - 8.0   Protein Ur, POC =100 (A) negative mg/dL   Urobilinogen, UA 0.2 0.2 or 1.0 E.U./dL   Nitrite, UA Negative Negative   Leukocytes, UA Trace (A) Negative  POCT Microscopic Urinalysis ([facility])  Result Value Ref Range   WBC,UR,HPF,POC None None WBC/hpf   RBC,UR,HPF,POC None None RBC/hpf   Bacteria None None, Too numerous to count   Mucus Absent Absent   Epithelial Cells, UR Per Microscopy Few (A) None, Too numerous to count cells/hpf       Assessment & Plan:    MELICIA ESQUEDA is a 46 y.o. female Urinary frequency - Plan: POCT urinalysis dipstick, POCT Microscopic Urinalysis ([facility]), Urine Culture  Acute cystitis without hematuria - Plan: sulfamethoxazole-trimethoprim (BACTRIM DS,SEPTRA DS) 800-160 MG tablet  Bilateral low back pain without sciatica, unspecified chronicity Suspected urinary tract infection, though no WBC on micro.  no true CVA tenderness. Start Septra for  one week based on timing of symptoms, but check urine cx. Potential side effects discussed.   Low back pain is likely muscular. Tylenol or Advil as needed, handout given on both conditions and RTC precautions discussed.   Meds ordered this encounter  Medications  . sulfamethoxazole-trimethoprim (BACTRIM DS,SEPTRA DS) 800-160 MG tablet    Sig: Take 1 tablet by mouth 2 (two) times daily.    Dispense:  14 tablet    Refill:  0   Patient Instructions   Start Septra one pill twice per day for 1 week. Okay to continue Azo for urinary symptoms. You should improve within the next few days, return if any worsening nausea, vomiting, abdominal pain, or fever.  Low back pain appears to be muscular, not due to kidneys. Tylenol or Advil as needed. Heat or ice if needed, and gentle range of motion and stretches. Return if the symptoms are worsening.  Urinary Tract Infection, Adult A urinary tract infection (UTI)  is an infection of any part of the urinary tract, which includes the kidneys, ureters, bladder, and urethra. These organs make, store, and get rid of urine in the body. UTI can be a bladder infection (cystitis) or kidney infection (pyelonephritis). What are the causes? This infection may be caused by fungi, viruses, or bacteria. Bacteria are the most common cause of UTIs. This condition can also be caused by repeated incomplete emptying of the bladder during urination. What increases the risk? This condition is more likely to develop if:  You ignore your need to urinate or hold urine for long periods of time.  You do not empty your bladder completely during urination.  You wipe back to front after urinating or having a bowel movement, if you are female.  You are uncircumcised, if you are female.  You are constipated.  You have a urinary catheter that stays in place (indwelling).  You have a weak defense (immune) system.  You have a medical condition that affects your bowels, kidneys, or bladder.  You have diabetes.  You take antibiotic medicines frequently or for long periods of time, and the antibiotics no longer work well against certain types of infections (antibiotic resistance).  You take medicines that irritate your urinary tract.  You are exposed to chemicals that irritate your urinary tract.  You are female.  What are the signs or symptoms? Symptoms of this condition include:  Fever.  Frequent urination or passing small amounts of urine frequently.  Needing to urinate urgently.  Pain or burning with urination.  Urine that smells bad or unusual.  Cloudy urine.  Pain in the lower abdomen or back.  Trouble urinating.  Blood in the urine.  Vomiting or being less hungry than normal.  Diarrhea or abdominal pain.  Vaginal discharge, if you are female.  How is this diagnosed? This condition is diagnosed with a medical history and physical exam. You will also  need to provide a urine sample to test your urine. Other tests may be done, including:  Blood tests.  Sexually transmitted disease (STD) testing.  If you have had more than one UTI, a cystoscopy or imaging studies may be done to determine the cause of the infections. How is this treated? Treatment for this condition often includes a combination of two or more of the following:  Antibiotic medicine.  Other medicines to treat less common causes of UTI.  Over-the-counter medicines to treat pain.  Drinking enough water to stay hydrated.  Follow these instructions at home:  Take over-the-counter and prescription medicines only as told by your health care provider.  If you were prescribed an antibiotic, take it as told by your health care provider. Do not stop taking the antibiotic even if you start to feel better.  Avoid alcohol, caffeine, tea, and carbonated beverages. They can irritate your bladder.  Drink enough fluid to keep your urine clear or pale yellow.  Keep all follow-up visits as told by your health care provider. This is important.  Make sure to: ? Empty your bladder often and completely. Do not hold urine for long periods of time. ? Empty your bladder before and after sex. ? Wipe from front to back after a bowel movement if you are female. Use each tissue one time when you wipe. Contact a health care provider if:  You have back pain.  You have a fever.  You feel nauseous or vomit.  Your symptoms do not get better after 3 days.  Your symptoms go away and then return. Get help right away if:  You have severe back pain or lower abdominal pain.  You are vomiting and cannot keep down any medicines or water. This information is not intended to replace advice given to you by your health care provider. Make sure you discuss any questions you have with your health care provider. Document Released: 12/06/2004 Document Revised: 08/10/2015 Document Reviewed:  01/17/2015 Elsevier Interactive Patient Education  2017 [employer]    Back Pain, Adult Back pain is very common in adults.The cause of back pain is rarely dangerous and the pain often gets better over time.The cause of your back pain may not be known. Some common causes of back pain include:  Strain of the muscles or ligaments supporting the spine.  Wear and tear (degeneration) of the spinal disks.  Arthritis.  Direct  injury to the back.  For many people, back pain may return. Since back pain is rarely dangerous, most people can learn to manage this condition on their own. Follow these instructions at home: Watch your back pain for any changes. The following actions may help to lessen any discomfort you are feeling:  Remain active. It is stressful on your back to sit or stand in one place for long periods of time. Do not sit, drive, or stand in one place for more than 30 minutes at a time. Take short walks on even surfaces as soon as you are able.Try to increase the length of time you walk each day.  Exercise regularly as directed by your health care provider. Exercise helps your back heal faster. It also helps avoid future injury by keeping your muscles strong and flexible.  Do not stay in bed.Resting more than 1-2 days can delay your recovery.  Pay attention to your body when you bend and lift. The most comfortable positions are those that put less stress on your recovering back. Always use proper lifting techniques, including: ? Bending your knees. ? Keeping the load close to your body. ? Avoiding twisting.  Find a comfortable position to sleep. Use a firm mattress and lie on your side with your knees slightly bent. If you lie on your back, put a pillow under your knees.  Avoid feeling anxious or stressed.Stress increases muscle tension and can worsen back pain.It is important to recognize when you are anxious or stressed and learn ways to manage it, such as with  exercise.  Take medicines only as directed by your health care provider. Over-the-counter medicines to reduce pain and inflammation are often the most helpful.Your health care provider may prescribe muscle relaxant drugs.These medicines help dull your pain so you can more quickly return to your normal activities and healthy exercise.  Apply ice to the injured area: ? Put ice in a plastic bag. ? Place a towel between your skin and the bag. ? Leave the ice on for 20 minutes, 2-3 times a day for the first 2-3 days. After that, ice and heat may be alternated to reduce pain and spasms.  Maintain a healthy weight. Excess weight puts extra stress on your back and makes it difficult to maintain good posture.  Contact a health care provider if:  You have pain that is not relieved with rest or medicine.  You have increasing pain going down into the legs or buttocks.  You have pain that does not improve in one week.  You have night pain.  You lose weight.  You have a fever or chills. Get help right away if:  You develop new bowel or bladder control problems.  You have unusual weakness or numbness in your arms or legs.  You develop nausea or vomiting.  You develop abdominal pain.  You feel faint. This information is not intended to replace advice given to you by your health care provider. Make sure you discuss any questions you have with your health care provider. Document Released: 02/26/2005 Document Revised: 07/07/2015 Document Reviewed: 06/30/2013 Elsevier Interactive Patient Education  2017 [employer].     IF you received an x-ray today, you will receive an invoice from [facility]. Please contact [facility] at [phone] with questions or concerns regarding your invoice.   IF you received labwork today, you will receive an invoice from [place]. Please contact [facility] at [phone] with questions or concerns regarding your invoice.   Our billing  staff will not be able to assist you with questions regarding bills from these companies.  You will be contacted with the lab results as soon as they are available. The fastest way to get your results is to activate your My Chart account. Instructions are located on the last page of this paperwork. If you have not heard from [place] regarding the results in 2 weeks, please contact this office.       Signed,   Merri Ray, MD [facility].  11/19/16 12:35 PM

## 2016-11-19 NOTE — Patient Instructions (Addendum)
Start Septra one pill twice per day for 1 week. Okay to continue Azo for urinary symptoms. You should improve within the next few days, return if any worsening nausea, vomiting, abdominal pain, or fever.  Low back pain appears to be muscular, not due to kidneys. Tylenol or Advil as needed. Heat or ice if needed, and gentle range of motion and stretches. Return if the symptoms are worsening.  Urinary Tract Infection, Adult A urinary tract infection (UTI) is an infection of any part of the urinary tract, which includes the kidneys, ureters, bladder, and urethra. These organs make, store, and get rid of urine in the body. UTI can be a bladder infection (cystitis) or kidney infection (pyelonephritis). What are the causes? This infection may be caused by fungi, viruses, or bacteria. Bacteria are the most common cause of UTIs. This condition can also be caused by repeated incomplete emptying of the bladder during urination. What increases the risk? This condition is more likely to develop if:  You ignore your need to urinate or hold urine for long periods of time.  You do not empty your bladder completely during urination.  You wipe back to front after urinating or having a bowel movement, if you are female.  You are uncircumcised, if you are female.  You are constipated.  You have a urinary catheter that stays in place (indwelling).  You have a weak defense (immune) system.  You have a medical condition that affects your bowels, kidneys, or bladder.  You have diabetes.  You take antibiotic medicines frequently or for long periods of time, and the antibiotics no longer work well against certain types of infections (antibiotic resistance).  You take medicines that irritate your urinary tract.  You are exposed to chemicals that irritate your urinary tract.  You are female.  What are the signs or symptoms? Symptoms of this condition include:  Fever.  Frequent urination or passing small  amounts of urine frequently.  Needing to urinate urgently.  Pain or burning with urination.  Urine that smells bad or unusual.  Cloudy urine.  Pain in the lower abdomen or back.  Trouble urinating.  Blood in the urine.  Vomiting or being less hungry than normal.  Diarrhea or abdominal pain.  Vaginal discharge, if you are female.  How is this diagnosed? This condition is diagnosed with a medical history and physical exam. You will also need to provide a urine sample to test your urine. Other tests may be done, including:  Blood tests.  Sexually transmitted disease (STD) testing.  If you have had more than one UTI, a cystoscopy or imaging studies may be done to determine the cause of the infections. How is this treated? Treatment for this condition often includes a combination of two or more of the following:  Antibiotic medicine.  Other medicines to treat less common causes of UTI.  Over-the-counter medicines to treat pain.  Drinking enough water to stay hydrated.  Follow these instructions at home:  Take over-the-counter and prescription medicines only as told by your health care provider.  If you were prescribed an antibiotic, take it as told by your health care provider. Do not stop taking the antibiotic even if you start to feel better.  Avoid alcohol, caffeine, tea, and carbonated beverages. They can irritate your bladder.  Drink enough fluid to keep your urine clear or pale yellow.  Keep all follow-up visits as told by your health care provider. This is important.  Make sure to: ? Empty  your bladder often and completely. Do not hold urine for long periods of time. ? Empty your bladder before and after sex. ? Wipe from front to back after a bowel movement if you are female. Use each tissue one time when you wipe. Contact a health care provider if:  You have back pain.  You have a fever.  You feel nauseous or vomit.  Your symptoms do not get better  after 3 days.  Your symptoms go away and then return. Get help right away if:  You have severe back pain or lower abdominal pain.  You are vomiting and cannot keep down any medicines or water. This information is not intended to replace advice given to you by your health care provider. Make sure you discuss any questions you have with your health care provider. Document Released: 12/06/2004 Document Revised: 08/10/2015 Document Reviewed: 01/17/2015 Elsevier Interactive Patient Education  2017 [employer] Inc.    Back Pain, Adult Back pain is very common in adults.The cause of back pain is rarely dangerous and the pain often gets better over time.The cause of your back pain may not be known. Some common causes of back pain include:  Strain of the muscles or ligaments supporting the spine.  Wear and tear (degeneration) of the spinal disks.  Arthritis.  Direct injury to the back.  For many people, back pain may return. Since back pain is rarely dangerous, most people can learn to manage this condition on their own. Follow these instructions at home: Watch your back pain for any changes. The following actions may help to lessen any discomfort you are feeling:  Remain active. It is stressful on your back to sit or stand in one place for long periods of time. Do not sit, drive, or stand in one place for more than 30 minutes at a time. Take short walks on even surfaces as soon as you are able.Try to increase the length of time you walk each day.  Exercise regularly as directed by your health care provider. Exercise helps your back heal faster. It also helps avoid future injury by keeping your muscles strong and flexible.  Do not stay in bed.Resting more than 1-2 days can delay your recovery.  Pay attention to your body when you bend and lift. The most comfortable positions are those that put less stress on your recovering back. Always use proper lifting techniques, including: ? Bending  your knees. ? Keeping the load close to your body. ? Avoiding twisting.  Find a comfortable position to sleep. Use a firm mattress and lie on your side with your knees slightly bent. If you lie on your back, put a pillow under your knees.  Avoid feeling anxious or stressed.Stress increases muscle tension and can worsen back pain.It is important to recognize when you are anxious or stressed and learn ways to manage it, such as with exercise.  Take medicines only as directed by your health care provider. Over-the-counter medicines to reduce pain and inflammation are often the most helpful.Your health care provider may prescribe muscle relaxant drugs.These medicines help dull your pain so you can more quickly return to your normal activities and healthy exercise.  Apply ice to the injured area: ? Put ice in a plastic bag. ? Place a towel between your skin and the bag. ? Leave the ice on for 20 minutes, 2-3 times a day for the first 2-3 days. After that, ice and heat may be alternated to reduce pain and spasms.  Maintain  a healthy weight. Excess weight puts extra stress on your back and makes it difficult to maintain good posture.  Contact a health care provider if:  You have pain that is not relieved with rest or medicine.  You have increasing pain going down into the legs or buttocks.  You have pain that does not improve in one week.  You have night pain.  You lose weight.  You have a fever or chills. Get help right away if:  You develop new bowel or bladder control problems.  You have unusual weakness or numbness in your arms or legs.  You develop nausea or vomiting.  You develop abdominal pain.  You feel faint. This information is not intended to replace advice given to you by your health care provider. Make sure you discuss any questions you have with your health care provider. Document Released: 02/26/2005 Document Revised: 07/07/2015 Document Reviewed:  06/30/2013 Elsevier Interactive Patient Education  2017 [employer].     IF you received an x-ray today, you will receive an invoice from [facility]. Please contact [facility] at [phone] with questions or concerns regarding your invoice.   IF you received labwork today, you will receive an invoice from [place]. Please contact [facility] at [phone] with questions or concerns regarding your invoice.   Our billing staff will not be able to assist you with questions regarding bills from these companies.  You will be contacted with the lab results as soon as they are available. The fastest way to get your results is to activate your My Chart account. Instructions are located on the last page of this paperwork. If you have not heard from [place] regarding the results in 2 weeks, please contact this office.

## 2016-11-20 LAB — URINE CULTURE

## 2016-11-26 ENCOUNTER — Telehealth: Payer: Self-pay | Admitting: *Deleted

## 2017-01-17 NOTE — Telephone Encounter (Signed)
error 

## 2017-02-25 ENCOUNTER — Other Ambulatory Visit: Payer: Self-pay | Admitting: Obstetrics & Gynecology

## 2017-02-25 DIAGNOSIS — Z1231 Encounter for screening mammogram for malignant neoplasm of breast: Secondary | ICD-10-CM

## 2017-03-26 ENCOUNTER — Ambulatory Visit
Admission: RE | Admit: 2017-03-26 | Discharge: 2017-03-26 | Disposition: A | Discharge status: Home / Self Care | Payer: BLUE CROSS/BLUE SHIELD | Source: Ambulatory Visit | Attending: Obstetrics & Gynecology | Admitting: Obstetrics & Gynecology

## 2017-03-26 DIAGNOSIS — Z1231 Encounter for screening mammogram for malignant neoplasm of breast: Secondary | ICD-10-CM

## 2017-03-28 ENCOUNTER — Other Ambulatory Visit: Payer: Self-pay | Admitting: Obstetrics & Gynecology

## 2017-03-28 DIAGNOSIS — R928 Other abnormal and inconclusive findings on diagnostic imaging of breast: Secondary | ICD-10-CM

## 2017-04-05 ENCOUNTER — Ambulatory Visit
Admission: RE | Admit: 2017-04-05 | Discharge: 2017-04-05 | Disposition: A | Discharge status: Home / Self Care | Payer: BLUE CROSS/BLUE SHIELD | Source: Ambulatory Visit | Attending: Obstetrics & Gynecology | Admitting: Obstetrics & Gynecology

## 2017-04-05 ENCOUNTER — Ambulatory Visit: Payer: BLUE CROSS/BLUE SHIELD

## 2017-04-05 DIAGNOSIS — R928 Other abnormal and inconclusive findings on diagnostic imaging of breast: Secondary | ICD-10-CM

## 2017-10-15 DIAGNOSIS — R300 Dysuria: Secondary | ICD-10-CM | POA: Diagnosis not present

## 2018-01-28 DIAGNOSIS — L218 Other seborrheic dermatitis: Secondary | ICD-10-CM | POA: Diagnosis not present

## 2018-01-28 DIAGNOSIS — L718 Other rosacea: Secondary | ICD-10-CM | POA: Diagnosis not present

## 2018-02-13 DIAGNOSIS — N920 Excessive and frequent menstruation with regular cycle: Secondary | ICD-10-CM | POA: Diagnosis not present

## 2018-02-13 DIAGNOSIS — Z01411 Encounter for gynecological examination (general) (routine) with abnormal findings: Secondary | ICD-10-CM | POA: Diagnosis not present

## 2018-05-13 ENCOUNTER — Other Ambulatory Visit: Payer: Self-pay | Admitting: Obstetrics & Gynecology

## 2018-05-13 DIAGNOSIS — Z1231 Encounter for screening mammogram for malignant neoplasm of breast: Secondary | ICD-10-CM

## 2018-06-12 ENCOUNTER — Ambulatory Visit: Payer: BLUE CROSS/BLUE SHIELD

## 2018-09-02 ENCOUNTER — Ambulatory Visit
Admission: RE | Admit: 2018-09-02 | Discharge: 2018-09-02 | Disposition: A | Discharge status: Home / Self Care | Payer: BC Managed Care – PPO | Source: Ambulatory Visit | Attending: Obstetrics & Gynecology | Admitting: Obstetrics & Gynecology

## 2018-09-02 ENCOUNTER — Other Ambulatory Visit: Payer: Self-pay

## 2018-09-02 DIAGNOSIS — Z1231 Encounter for screening mammogram for malignant neoplasm of breast: Secondary | ICD-10-CM | POA: Diagnosis not present

## 2018-10-30 DIAGNOSIS — Z20828 Contact with and (suspected) exposure to other viral communicable diseases: Secondary | ICD-10-CM | POA: Diagnosis not present

## 2018-11-19 DIAGNOSIS — R102 Pelvic and perineal pain: Secondary | ICD-10-CM | POA: Diagnosis not present

## 2018-11-19 DIAGNOSIS — Z3202 Encounter for pregnancy test, result negative: Secondary | ICD-10-CM | POA: Diagnosis not present

## 2018-11-19 DIAGNOSIS — N760 Acute vaginitis: Secondary | ICD-10-CM | POA: Diagnosis not present

## 2018-11-19 DIAGNOSIS — R3989 Other symptoms and signs involving the genitourinary system: Secondary | ICD-10-CM | POA: Diagnosis not present

## 2019-02-17 DIAGNOSIS — Z01419 Encounter for gynecological examination (general) (routine) without abnormal findings: Secondary | ICD-10-CM | POA: Diagnosis not present

## 2019-02-17 DIAGNOSIS — Z113 Encounter for screening for infections with a predominantly sexual mode of transmission: Secondary | ICD-10-CM | POA: Diagnosis not present

## 2019-04-08 DIAGNOSIS — R3989 Other symptoms and signs involving the genitourinary system: Secondary | ICD-10-CM | POA: Diagnosis not present

## 2019-08-17 DIAGNOSIS — Z0131 Encounter for examination of blood pressure with abnormal findings: Secondary | ICD-10-CM | POA: Diagnosis not present

## 2019-09-21 DIAGNOSIS — Z79899 Other long term (current) drug therapy: Secondary | ICD-10-CM | POA: Diagnosis not present

## 2019-09-21 DIAGNOSIS — I10 Essential (primary) hypertension: Secondary | ICD-10-CM | POA: Diagnosis not present

## 2019-10-29 DIAGNOSIS — R3989 Other symptoms and signs involving the genitourinary system: Secondary | ICD-10-CM | POA: Diagnosis not present

## 2019-11-03 DIAGNOSIS — N3010 Interstitial cystitis (chronic) without hematuria: Secondary | ICD-10-CM | POA: Diagnosis not present

## 2020-01-28 DIAGNOSIS — I10 Essential (primary) hypertension: Secondary | ICD-10-CM | POA: Diagnosis not present

## 2020-02-18 DIAGNOSIS — Z79899 Other long term (current) drug therapy: Secondary | ICD-10-CM | POA: Diagnosis not present

## 2020-02-18 DIAGNOSIS — I10 Essential (primary) hypertension: Secondary | ICD-10-CM | POA: Diagnosis not present

## 2020-02-18 DIAGNOSIS — Z7182 Exercise counseling: Secondary | ICD-10-CM | POA: Diagnosis not present

## 2020-02-22 DIAGNOSIS — B373 Candidiasis of vulva and vagina: Secondary | ICD-10-CM | POA: Diagnosis not present

## 2020-02-26 DIAGNOSIS — N9089 Other specified noninflammatory disorders of vulva and perineum: Secondary | ICD-10-CM | POA: Diagnosis not present

## 2020-02-26 DIAGNOSIS — N760 Acute vaginitis: Secondary | ICD-10-CM | POA: Diagnosis not present

## 2020-02-26 DIAGNOSIS — N898 Other specified noninflammatory disorders of vagina: Secondary | ICD-10-CM | POA: Diagnosis not present

## 2020-04-05 ENCOUNTER — Other Ambulatory Visit: Payer: Self-pay | Admitting: Obstetrics and Gynecology

## 2020-04-05 DIAGNOSIS — N6452 Nipple discharge: Secondary | ICD-10-CM

## 2020-04-05 DIAGNOSIS — N644 Mastodynia: Secondary | ICD-10-CM

## 2020-04-05 DIAGNOSIS — Z01419 Encounter for gynecological examination (general) (routine) without abnormal findings: Secondary | ICD-10-CM | POA: Diagnosis not present

## 2020-04-05 DIAGNOSIS — L718 Other rosacea: Secondary | ICD-10-CM | POA: Diagnosis not present

## 2020-05-18 ENCOUNTER — Other Ambulatory Visit: Payer: Self-pay

## 2020-05-18 ENCOUNTER — Ambulatory Visit
Admission: RE | Admit: 2020-05-18 | Discharge: 2020-05-18 | Disposition: A | Discharge status: Home / Self Care | Payer: BC Managed Care – PPO | Source: Ambulatory Visit | Attending: Obstetrics and Gynecology | Admitting: Obstetrics and Gynecology

## 2020-05-18 ENCOUNTER — Ambulatory Visit: Payer: BC Managed Care – PPO

## 2020-05-18 DIAGNOSIS — N644 Mastodynia: Secondary | ICD-10-CM

## 2020-05-18 DIAGNOSIS — N6452 Nipple discharge: Secondary | ICD-10-CM

## 2020-05-20 DIAGNOSIS — Z202 Contact with and (suspected) exposure to infections with a predominantly sexual mode of transmission: Secondary | ICD-10-CM | POA: Diagnosis not present

## 2020-05-20 DIAGNOSIS — A599 Trichomoniasis, unspecified: Secondary | ICD-10-CM | POA: Diagnosis not present

## 2020-05-24 DIAGNOSIS — I10 Essential (primary) hypertension: Secondary | ICD-10-CM | POA: Diagnosis not present

## 2020-05-24 DIAGNOSIS — Z79899 Other long term (current) drug therapy: Secondary | ICD-10-CM | POA: Diagnosis not present

## 2020-07-20 ENCOUNTER — Ambulatory Visit (HOSPITAL_COMMUNITY)
Admission: EM | Admit: 2020-07-20 | Discharge: 2020-07-20 | Disposition: A | Discharge status: Home / Self Care | Payer: BC Managed Care – PPO | Attending: Family Medicine | Admitting: Family Medicine

## 2020-07-20 ENCOUNTER — Other Ambulatory Visit: Payer: BC Managed Care – PPO

## 2020-07-20 ENCOUNTER — Other Ambulatory Visit: Payer: Self-pay

## 2020-07-20 ENCOUNTER — Encounter (HOSPITAL_COMMUNITY): Payer: Self-pay

## 2020-07-20 DIAGNOSIS — R051 Acute cough: Secondary | ICD-10-CM | POA: Diagnosis not present

## 2020-07-20 DIAGNOSIS — J069 Acute upper respiratory infection, unspecified: Secondary | ICD-10-CM

## 2020-07-20 DIAGNOSIS — U071 COVID-19: Secondary | ICD-10-CM | POA: Insufficient documentation

## 2020-07-20 DIAGNOSIS — R509 Fever, unspecified: Secondary | ICD-10-CM | POA: Diagnosis not present

## 2020-07-20 HISTORY — DX: Essential (primary) hypertension: I10

## 2020-07-20 LAB — POC INFLUENZA A AND B ANTIGEN (URGENT CARE ONLY)
INFLUENZA A ANTIGEN, POC: NEGATIVE
INFLUENZA B ANTIGEN, POC: NEGATIVE

## 2020-07-20 LAB — SARS CORONAVIRUS 2 (TAT 6-24 HRS): SARS Coronavirus 2: POSITIVE — AB

## 2020-07-20 MED ORDER — IBUPROFEN 800 MG PO TABS
800.0000 mg | ORAL_TABLET | Freq: Once | ORAL | Status: AC
  Administered 2020-07-20: 800 mg via ORAL

## 2020-07-20 MED ORDER — IBUPROFEN 800 MG PO TABS
ORAL_TABLET | ORAL | Status: AC
Start: 2020-07-20 — End: ?
  Filled 2020-07-20: qty 1

## 2020-07-20 MED ORDER — PROMETHAZINE-DM 6.25-15 MG/5ML PO SYRP: 5.0000 mL | ORAL_SOLUTION | Freq: Four times a day (QID) | ORAL | 0 refills | Status: DC | PRN

## 2020-07-20 NOTE — ED Provider Notes (Signed)
[place]    CSN: [id number] Arrival date & time: 07/20/20  3086      History   Chief Complaint Chief Complaint  Patient presents with  . Fever  . Cough  . Chest congestion    HPI Ann Bird is a 50 y.o. female.   Here today with fever, chest congestion, productive cough, sore throat that started 2 days ago.  She states some soreness in her chest with coughing but otherwise no chest pain, shortness of breath, abdominal pain, nausea vomiting diarrhea.  No new sick contacts that she is aware of.  No pertinent chronic medical problems.  Taking Tylenol and Mucinex with mild temporary relief, last dose of Tylenol at 6 AM this morning.  Home COVID test at onset of symptoms negative.    Past Medical History:  Diagnosis Date  . Fibroid, uterine   . Hypertension     There are no problems to display for this patient.   History reviewed. No pertinent surgical history.  OB History   No obstetric history on file.      Home Medications    Prior to Admission medications   Medication Sig Start Date End Date Taking? Authorizing Provider  promethazine-dextromethorphan (PROMETHAZINE-DM) 6.25-15 MG/5ML syrup Take 5 mLs by mouth 4 (four) times daily as needed for cough. 07/20/20  Yes Volney American, PA-C  chlorthalidone (HYGROTON) 25 MG tablet chlorthalidone 25 mg tablet  TAKE 1/2 (ONE-HALF) TABLET BY MOUTH ONCE DAILY AS NEEDED    [provider]  Cholecalciferol (VITAMIN D) 50 MCG (2000 UT) tablet 1 tablet    [provider]  hydrochlorothiazide (HYDRODIURIL) 25 MG tablet 1 tablet in the morning    [provider]  IRON, FERROUS SULFATE, PO Take 1 tablet by mouth. Every other day    [provider]  losartan (COZAAR) 50 MG tablet 1 tablet    [provider]  Multiple Vitamin (MULTIVITAMIN) tablet Take 1 tablet by mouth daily.    [provider]  sulfamethoxazole-trimethoprim (BACTRIM DS,SEPTRA DS)  800-160 MG tablet Take 1 tablet by mouth 2 (two) times daily. 11/19/16   Wendie Agreste, MD  triamcinolone cream (KENALOG) 0.1 % Apply 1 application topically 2 (two) times daily. 02/15/16   Wendie Agreste, MD    Family History Family History  Problem Relation Age of Onset  . Pulmonary embolism Mother   . Colon cancer Father     Social History Social History   Tobacco Use  . Smoking status: Never Smoker  . Smokeless tobacco: Never Used  Substance Use Topics  . Alcohol use: Yes    Alcohol/week: 0.0 standard drinks    Comment: occasional. 1 glass of wine every other week  . Drug use: No     Allergies   Patient has no known allergies.   Review of Systems Review of Systems Per HPI  Physical Exam Triage Vital Signs ED Triage Vitals  Enc Vitals Group     BP 07/20/20 0826 (!) 148/86     Pulse Rate 07/20/20 0826 (!) 106     Resp 07/20/20 0826 20     Temp 07/20/20 0826 (!) 100.5 F (38.1 C)     Temp src --      SpO2 07/20/20 0826 99 %     Weight --      Height --      Head Circumference --      Peak Flow --      Pain Score  07/20/20 0824 5     Pain Loc --      Pain Edu? --      Excl. in [place]? --    No data found.  Updated Vital Signs BP (!) 148/86   Pulse (!) 106   Temp (!) 100.5 F (38.1 C)   Resp 20   LMP 06/24/2020 (Approximate)   SpO2 99%   Visual Acuity Right Eye Distance:   Left Eye Distance:   Bilateral Distance:    Right Eye Near:   Left Eye Near:    Bilateral Near:     Physical Exam Vitals and nursing note reviewed.  Constitutional:      Appearance: Normal appearance. She is not ill-appearing.  HENT:     Head: Atraumatic.     Right Ear: Tympanic membrane normal.     Left Ear: Tympanic membrane normal.     Nose: Rhinorrhea present.     Mouth/Throat:     Mouth: Mucous membranes are moist.     Pharynx: Posterior oropharyngeal erythema present.  Eyes:     Extraocular Movements: Extraocular movements intact.     Conjunctiva/sclera:  Conjunctivae normal.  Cardiovascular:     Rate and Rhythm: Normal rate and regular rhythm.     Heart sounds: Normal heart sounds.  Pulmonary:     Effort: Pulmonary effort is normal. No respiratory distress.     Breath sounds: Normal breath sounds. No wheezing or rales.  Abdominal:     General: Bowel sounds are normal. There is no distension.     Palpations: Abdomen is soft.     Tenderness: There is no abdominal tenderness. There is no right CVA tenderness, left CVA tenderness or guarding.  Musculoskeletal:        General: Normal range of motion.     Cervical back: Normal range of motion and neck supple.  Skin:    General: Skin is warm and dry.  Neurological:     Mental Status: She is alert and oriented to person, place, and time.  Psychiatric:        Mood and Affect: Mood normal.        Thought Content: Thought content normal.        Judgment: Judgment normal.      UC Treatments / Results  Labs (all labs ordered are listed, but only abnormal results are displayed) Labs Reviewed  SARS CORONAVIRUS 2 (TAT 6-24 HRS)  POC INFLUENZA A AND B ANTIGEN (URGENT CARE ONLY)    EKG   Radiology No results found.  Procedures Procedures (including critical care time)  Medications Ordered in UC Medications  ibuprofen (ADVIL) tablet 800 mg (800 mg Oral Given 07/20/20 0835)    Initial Impression / Assessment and Plan / UC Course  I have reviewed the triage vital signs and the nursing notes.  Pertinent labs & imaging results that were available during my care of the patient were reviewed by me and considered in my medical decision making (see chart for details).     Febrile today in triage, ibuprofen given as she already had Tylenol at that time.  Exam very reassuring today.  Flu testing negative, COVID PCR pending.  Work note given with isolation precautions, Phenergan DM and other over-the-counter remedies reviewed.  Follow-up for acutely worsening symptoms.  Final Clinical  Impressions(s) / UC Diagnoses   Final diagnoses:  Viral URI with cough   Discharge Instructions   None    ED Prescriptions    Medication Sig Dispense Auth.  Provider   promethazine-dextromethorphan (PROMETHAZINE-DM) 6.25-15 MG/5ML syrup Take 5 mLs by mouth 4 (four) times daily as needed for cough. 100 mL Volney American, [place]     PDMP not reviewed this encounter.   Volney American, [place] 07/20/20 1525

## 2020-07-20 NOTE — ED Triage Notes (Addendum)
Pt in with c/o fever tmax 99.3, chest congestion, productive cough and ST that started on Monday. Pt states when she was coughing this morning her chest was hurting.   Pt has been taking mucinex and tylenol for sx  Pt last dose of tylenol at 6 am

## 2020-07-27 DIAGNOSIS — R059 Cough, unspecified: Secondary | ICD-10-CM | POA: Diagnosis not present

## 2020-07-27 DIAGNOSIS — I10 Essential (primary) hypertension: Secondary | ICD-10-CM | POA: Diagnosis not present

## 2020-08-24 ENCOUNTER — Ambulatory Visit
Admission: RE | Admit: 2020-08-24 | Discharge: 2020-08-24 | Disposition: A | Discharge status: Home / Self Care | Payer: BC Managed Care – PPO | Source: Ambulatory Visit | Attending: Obstetrics and Gynecology | Admitting: Obstetrics and Gynecology

## 2020-08-24 ENCOUNTER — Other Ambulatory Visit: Payer: Self-pay

## 2020-08-24 DIAGNOSIS — N6452 Nipple discharge: Secondary | ICD-10-CM | POA: Diagnosis not present

## 2020-08-24 DIAGNOSIS — N644 Mastodynia: Secondary | ICD-10-CM

## 2020-08-30 DIAGNOSIS — Z136 Encounter for screening for cardiovascular disorders: Secondary | ICD-10-CM | POA: Diagnosis not present

## 2020-08-30 DIAGNOSIS — Z23 Encounter for immunization: Secondary | ICD-10-CM | POA: Diagnosis not present

## 2020-08-30 DIAGNOSIS — Z1211 Encounter for screening for malignant neoplasm of colon: Secondary | ICD-10-CM | POA: Diagnosis not present

## 2020-08-30 DIAGNOSIS — Z0000 Encounter for general adult medical examination without abnormal findings: Secondary | ICD-10-CM | POA: Diagnosis not present

## 2020-08-30 DIAGNOSIS — Z6838 Body mass index (BMI) 38.0-38.9, adult: Secondary | ICD-10-CM | POA: Diagnosis not present

## 2020-09-09 DIAGNOSIS — Z1159 Encounter for screening for other viral diseases: Secondary | ICD-10-CM | POA: Diagnosis not present

## 2020-09-09 DIAGNOSIS — Z0000 Encounter for general adult medical examination without abnormal findings: Secondary | ICD-10-CM | POA: Diagnosis not present

## 2020-09-09 DIAGNOSIS — Z136 Encounter for screening for cardiovascular disorders: Secondary | ICD-10-CM | POA: Diagnosis not present

## 2020-10-11 DIAGNOSIS — K5730 Diverticulosis of large intestine without perforation or abscess without bleeding: Secondary | ICD-10-CM | POA: Diagnosis not present

## 2020-10-11 DIAGNOSIS — Z800 Family history of malignant neoplasm of digestive organs: Secondary | ICD-10-CM | POA: Diagnosis not present

## 2020-10-11 DIAGNOSIS — K648 Other hemorrhoids: Secondary | ICD-10-CM | POA: Diagnosis not present

## 2020-10-11 DIAGNOSIS — Z1211 Encounter for screening for malignant neoplasm of colon: Secondary | ICD-10-CM | POA: Diagnosis not present

## 2020-10-25 DIAGNOSIS — N6452 Nipple discharge: Secondary | ICD-10-CM | POA: Diagnosis not present

## 2020-11-01 ENCOUNTER — Other Ambulatory Visit: Payer: Self-pay | Admitting: General Surgery

## 2020-11-01 DIAGNOSIS — N6452 Nipple discharge: Secondary | ICD-10-CM

## 2020-11-22 ENCOUNTER — Ambulatory Visit
Admission: RE | Admit: 2020-11-22 | Discharge: 2020-11-22 | Disposition: A | Discharge status: Home / Self Care | Payer: BC Managed Care – PPO | Source: Ambulatory Visit | Attending: General Surgery | Admitting: General Surgery

## 2020-11-22 DIAGNOSIS — N6452 Nipple discharge: Secondary | ICD-10-CM

## 2020-11-22 MED ORDER — GADOBUTROL 1 MMOL/ML IV SOLN
9.0000 mL | Freq: Once | INTRAVENOUS | Status: AC | PRN
  Administered 2020-11-22: 9 mL via INTRAVENOUS

## 2020-11-29 DIAGNOSIS — I10 Essential (primary) hypertension: Secondary | ICD-10-CM | POA: Diagnosis not present

## 2020-12-06 DIAGNOSIS — I10 Essential (primary) hypertension: Secondary | ICD-10-CM | POA: Diagnosis not present

## 2020-12-07 DIAGNOSIS — N6452 Nipple discharge: Secondary | ICD-10-CM | POA: Diagnosis not present

## 2020-12-13 ENCOUNTER — Other Ambulatory Visit: Payer: Self-pay | Admitting: General Surgery

## 2020-12-13 DIAGNOSIS — R9389 Abnormal findings on diagnostic imaging of other specified body structures: Secondary | ICD-10-CM

## 2021-02-10 DIAGNOSIS — Z0000 Encounter for general adult medical examination without abnormal findings: Secondary | ICD-10-CM | POA: Diagnosis not present

## 2021-02-10 DIAGNOSIS — Z23 Encounter for immunization: Secondary | ICD-10-CM | POA: Diagnosis not present

## 2021-02-10 DIAGNOSIS — Z13220 Encounter for screening for lipoid disorders: Secondary | ICD-10-CM | POA: Diagnosis not present

## 2021-02-10 DIAGNOSIS — I10 Essential (primary) hypertension: Secondary | ICD-10-CM | POA: Diagnosis not present

## 2021-02-21 DIAGNOSIS — N898 Other specified noninflammatory disorders of vagina: Secondary | ICD-10-CM | POA: Diagnosis not present

## 2021-02-21 DIAGNOSIS — Z202 Contact with and (suspected) exposure to infections with a predominantly sexual mode of transmission: Secondary | ICD-10-CM | POA: Diagnosis not present

## 2021-02-21 DIAGNOSIS — R350 Frequency of micturition: Secondary | ICD-10-CM | POA: Diagnosis not present

## 2021-03-15 ENCOUNTER — Other Ambulatory Visit: Payer: BC Managed Care – PPO

## 2021-04-06 DIAGNOSIS — Z01419 Encounter for gynecological examination (general) (routine) without abnormal findings: Secondary | ICD-10-CM | POA: Diagnosis not present

## 2021-04-06 DIAGNOSIS — Z124 Encounter for screening for malignant neoplasm of cervix: Secondary | ICD-10-CM | POA: Diagnosis not present

## 2021-06-03 IMAGING — MG DIGITAL SCREENING BILATERAL MAMMOGRAM WITH TOMO AND CAD
8 series · 8 of 24 positions shown · non-contrast
Comparison: Previous exam(s).

CLINICAL DATA: Screening.

EXAM:
DIGITAL SCREENING BILATERAL MAMMOGRAM WITH TOMO AND CAD

[L MLO synth-2D]
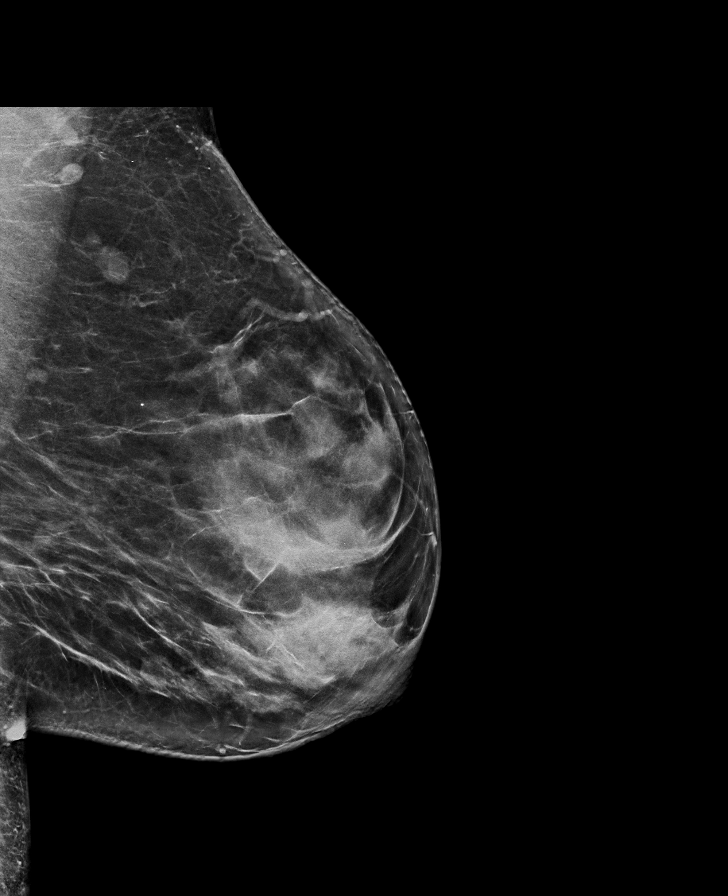

[L CC synth-2D]
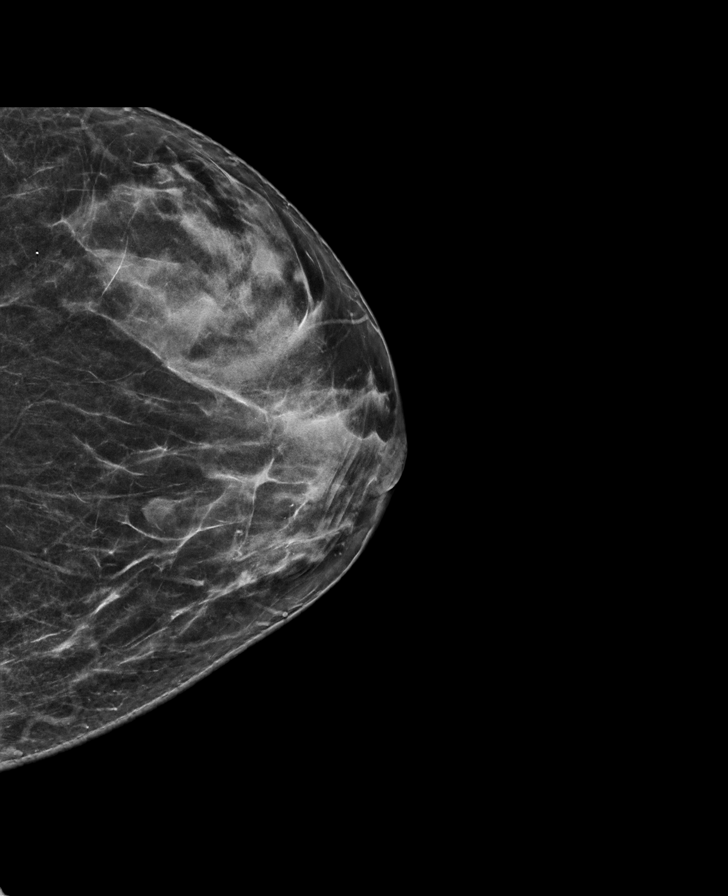

[R MLO synth-2D]
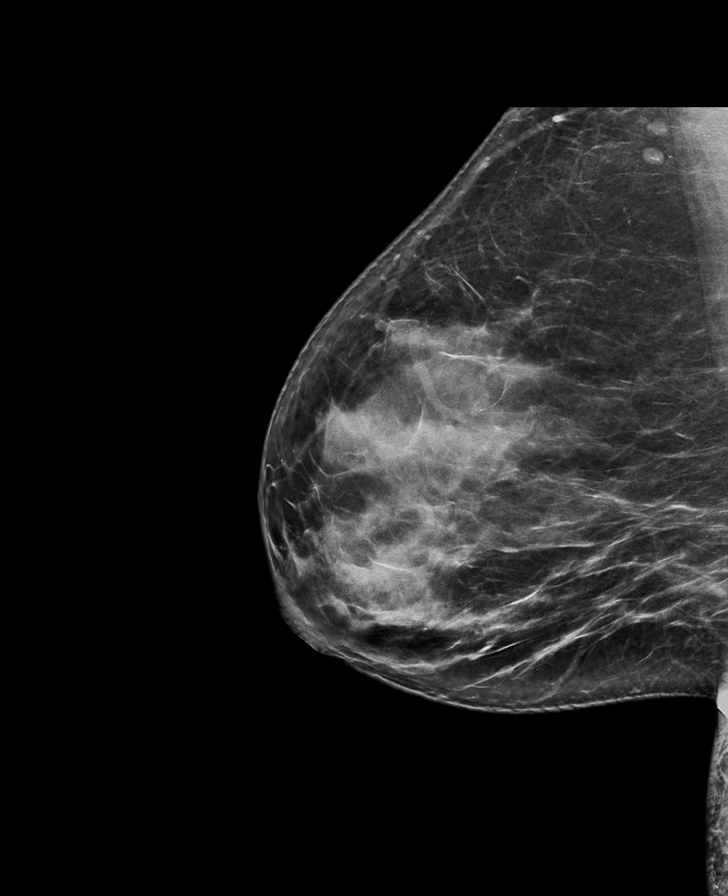

[R CC synth-2D]
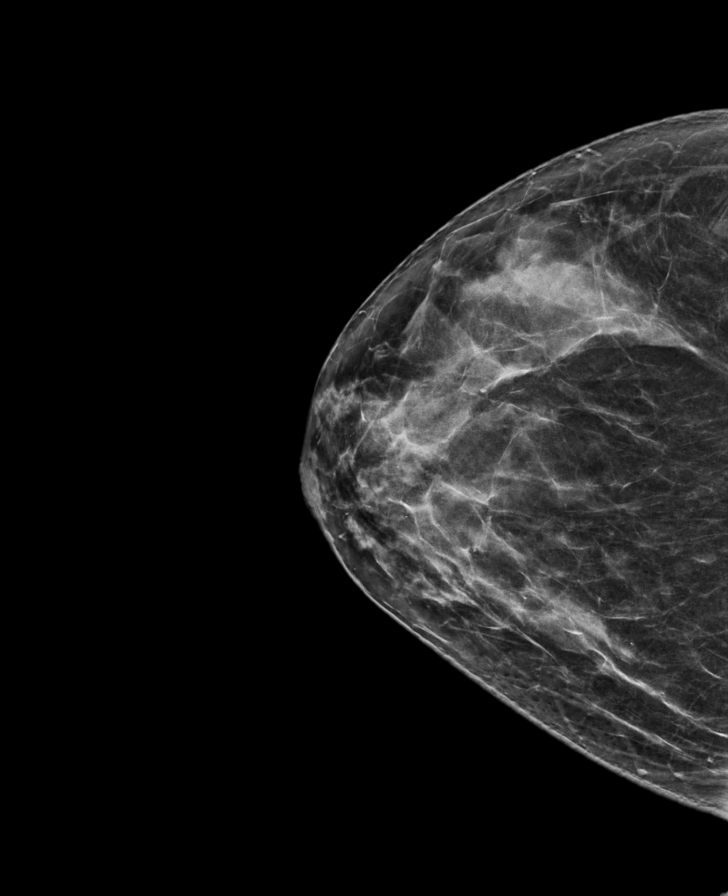

[L CC tomo · tomo slice 33/66.0]
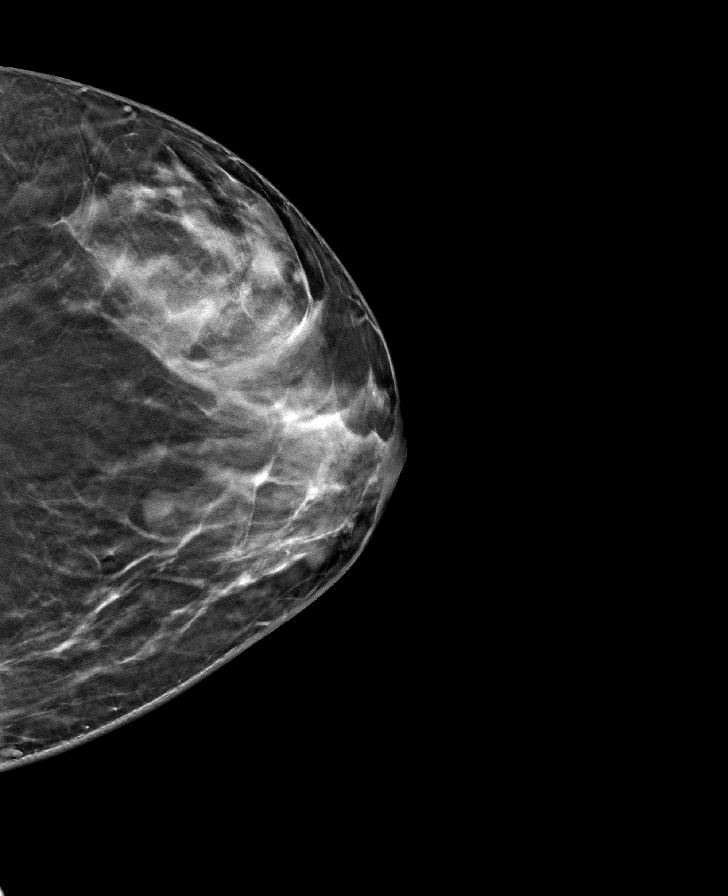

[L MLO tomo · tomo slice 41/81.0]
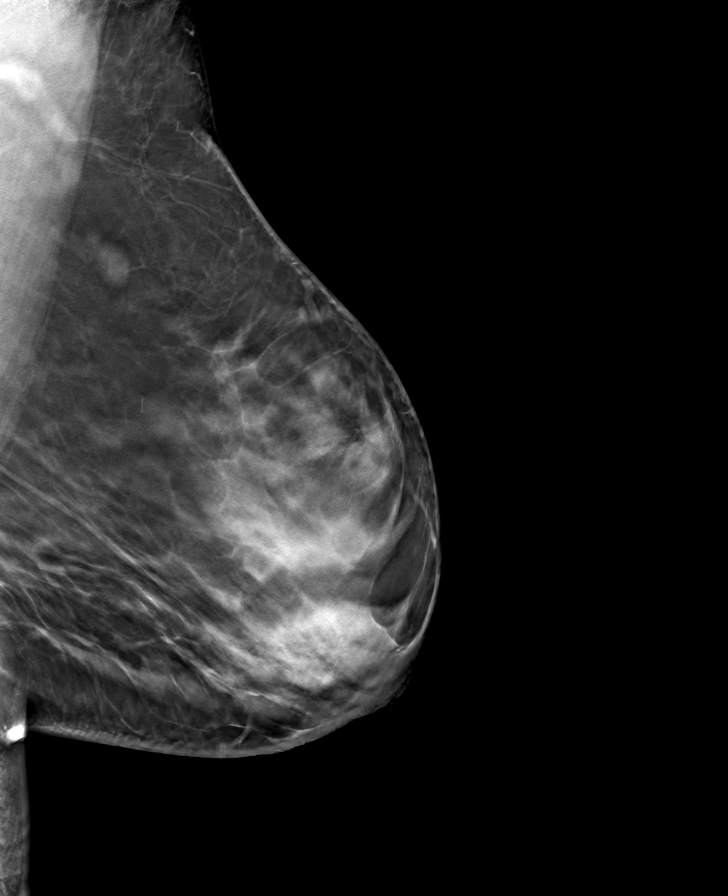

[R CC tomo · tomo slice 37/72.0]
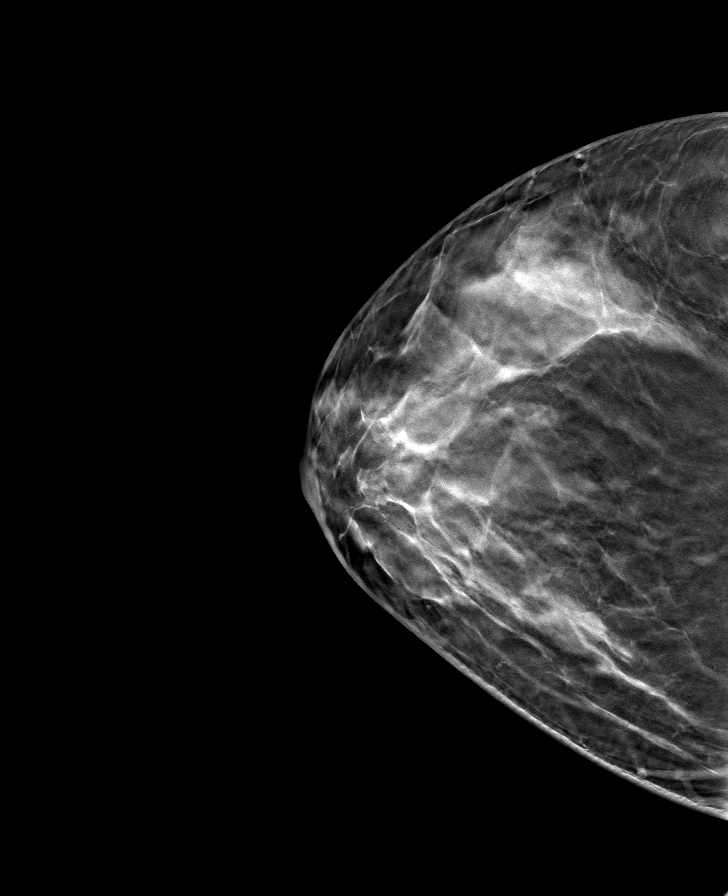

[R MLO tomo · tomo slice 41/82.0]
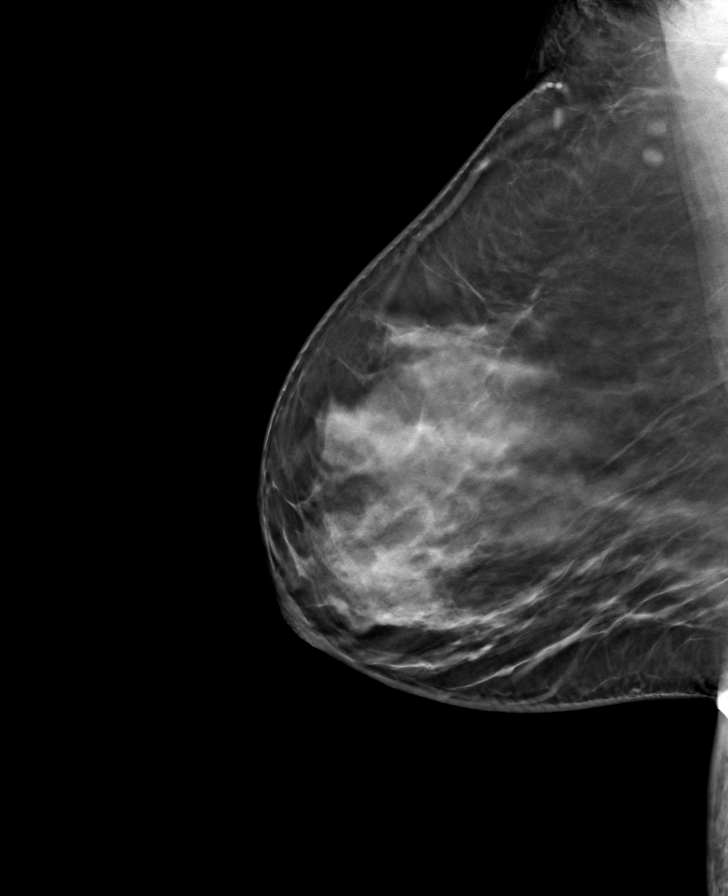

[8 of 24 positions shown; findings below may reference images not displayed]

ACR Breast Density Category c: The breast tissue is heterogeneously
dense, which may obscure small masses.
FINDINGS: There are no findings suspicious for malignancy. Images were
processed with CAD.
IMPRESSION: No mammographic evidence of malignancy. A result letter of this
screening mammogram will be mailed directly to the patient.

RECOMMENDATION:
Screening mammogram in one year. (Code:FT-U-LHB)

BI-RADS CATEGORY  1: Negative.

## 2021-08-30 DIAGNOSIS — E669 Obesity, unspecified: Secondary | ICD-10-CM | POA: Diagnosis not present

## 2021-08-30 DIAGNOSIS — I10 Essential (primary) hypertension: Secondary | ICD-10-CM | POA: Diagnosis not present

## 2021-08-30 DIAGNOSIS — Z23 Encounter for immunization: Secondary | ICD-10-CM | POA: Diagnosis not present

## 2022-01-16 DIAGNOSIS — J029 Acute pharyngitis, unspecified: Secondary | ICD-10-CM | POA: Diagnosis not present

## 2022-01-16 DIAGNOSIS — B349 Viral infection, unspecified: Secondary | ICD-10-CM | POA: Diagnosis not present

## 2022-01-16 DIAGNOSIS — R059 Cough, unspecified: Secondary | ICD-10-CM | POA: Diagnosis not present

## 2022-02-12 DIAGNOSIS — E559 Vitamin D deficiency, unspecified: Secondary | ICD-10-CM | POA: Diagnosis not present

## 2022-02-12 DIAGNOSIS — N3010 Interstitial cystitis (chronic) without hematuria: Secondary | ICD-10-CM | POA: Diagnosis not present

## 2022-02-12 DIAGNOSIS — Z13220 Encounter for screening for lipoid disorders: Secondary | ICD-10-CM | POA: Diagnosis not present

## 2022-02-12 DIAGNOSIS — R809 Proteinuria, unspecified: Secondary | ICD-10-CM | POA: Diagnosis not present

## 2022-02-12 DIAGNOSIS — E669 Obesity, unspecified: Secondary | ICD-10-CM | POA: Diagnosis not present

## 2022-02-12 DIAGNOSIS — Z0189 Encounter for other specified special examinations: Secondary | ICD-10-CM | POA: Diagnosis not present

## 2022-02-12 DIAGNOSIS — I10 Essential (primary) hypertension: Secondary | ICD-10-CM | POA: Diagnosis not present

## 2022-04-13 DIAGNOSIS — Z01419 Encounter for gynecological examination (general) (routine) without abnormal findings: Secondary | ICD-10-CM | POA: Diagnosis not present

## 2022-05-15 DIAGNOSIS — R7309 Other abnormal glucose: Secondary | ICD-10-CM | POA: Diagnosis not present

## 2022-08-17 DIAGNOSIS — R7309 Other abnormal glucose: Secondary | ICD-10-CM | POA: Diagnosis not present

## 2022-08-17 DIAGNOSIS — I10 Essential (primary) hypertension: Secondary | ICD-10-CM | POA: Diagnosis not present

## 2022-08-17 DIAGNOSIS — R809 Proteinuria, unspecified: Secondary | ICD-10-CM | POA: Diagnosis not present

## 2022-10-09 ENCOUNTER — Other Ambulatory Visit: Payer: Self-pay | Admitting: General Surgery

## 2022-10-09 DIAGNOSIS — N6320 Unspecified lump in the left breast, unspecified quadrant: Secondary | ICD-10-CM

## 2022-10-19 ENCOUNTER — Ambulatory Visit: Admission: RE | Admit: 2022-10-19 | Payer: BC Managed Care – PPO | Source: Ambulatory Visit

## 2022-10-19 ENCOUNTER — Ambulatory Visit
Admission: RE | Admit: 2022-10-19 | Discharge: 2022-10-19 | Disposition: A | Discharge status: Home / Self Care | Payer: BC Managed Care – PPO | Source: Ambulatory Visit | Attending: General Surgery | Admitting: General Surgery

## 2022-10-19 DIAGNOSIS — N6320 Unspecified lump in the left breast, unspecified quadrant: Secondary | ICD-10-CM

## 2022-10-19 DIAGNOSIS — N6324 Unspecified lump in the left breast, lower inner quadrant: Secondary | ICD-10-CM | POA: Diagnosis not present

## 2022-11-08 ENCOUNTER — Other Ambulatory Visit: Payer: Self-pay | Admitting: General Surgery

## 2022-11-08 DIAGNOSIS — N6320 Unspecified lump in the left breast, unspecified quadrant: Secondary | ICD-10-CM

## 2023-01-12 DIAGNOSIS — J40 Bronchitis, not specified as acute or chronic: Secondary | ICD-10-CM | POA: Diagnosis not present

## 2023-01-12 DIAGNOSIS — R058 Other specified cough: Secondary | ICD-10-CM | POA: Diagnosis not present

## 2023-01-21 DIAGNOSIS — U099 Post covid-19 condition, unspecified: Secondary | ICD-10-CM | POA: Diagnosis not present

## 2023-01-21 DIAGNOSIS — R053 Chronic cough: Secondary | ICD-10-CM | POA: Diagnosis not present

## 2023-04-03 ENCOUNTER — Encounter: Payer: Self-pay | Admitting: Family Medicine

## 2023-04-03 ENCOUNTER — Ambulatory Visit: Payer: BC Managed Care – PPO | Admitting: Family Medicine

## 2023-04-03 VITALS — BP 128/82 | HR 85 | Temp 97.6°F | Ht 61.8 in | Wt 195.0 lb

## 2023-04-03 DIAGNOSIS — R809 Proteinuria, unspecified: Secondary | ICD-10-CM | POA: Diagnosis not present

## 2023-04-03 DIAGNOSIS — E785 Hyperlipidemia, unspecified: Secondary | ICD-10-CM | POA: Diagnosis not present

## 2023-04-03 DIAGNOSIS — E611 Iron deficiency: Secondary | ICD-10-CM | POA: Insufficient documentation

## 2023-04-03 DIAGNOSIS — R350 Frequency of micturition: Secondary | ICD-10-CM

## 2023-04-03 DIAGNOSIS — I10 Essential (primary) hypertension: Secondary | ICD-10-CM | POA: Diagnosis not present

## 2023-04-03 DIAGNOSIS — N3000 Acute cystitis without hematuria: Secondary | ICD-10-CM

## 2023-04-03 DIAGNOSIS — Z7689 Persons encountering health services in other specified circumstances: Secondary | ICD-10-CM

## 2023-04-03 DIAGNOSIS — E559 Vitamin D deficiency, unspecified: Secondary | ICD-10-CM

## 2023-04-03 DIAGNOSIS — Z6835 Body mass index (BMI) 35.0-35.9, adult: Secondary | ICD-10-CM

## 2023-04-03 DIAGNOSIS — E669 Obesity, unspecified: Secondary | ICD-10-CM

## 2023-04-03 LAB — VITAMIN B12: Vitamin B-12: 860 pg/mL (ref 211–911)

## 2023-04-03 LAB — LIPID PANEL
Cholesterol: 202 mg/dL — ABNORMAL HIGH (ref 0–200)
HDL: 47.4 mg/dL (ref >39.00)
LDL Cholesterol: 138 mg/dL — ABNORMAL HIGH (ref 0–99)
NonHDL: 154.99
Total CHOL/HDL Ratio: 4
Triglycerides: 84 mg/dL (ref 0.0–149.0)
VLDL: 16.8 mg/dL (ref 0.0–40.0)

## 2023-04-03 LAB — POC URINALSYSI DIPSTICK (AUTOMATED)
Bilirubin, UA: NEGATIVE
Blood, UA: NEGATIVE
Glucose, UA: NEGATIVE
Ketones, UA: NEGATIVE
Nitrite, UA: POSITIVE
Protein, UA: POSITIVE — AB
Spec Grav, UA: >=1.03 — AB (ref 1.010–1.025)
Urobilinogen, UA: 1 U/dL
pH, UA: 5.5 (ref 5.0–8.0)

## 2023-04-03 LAB — COMPREHENSIVE METABOLIC PANEL
ALT: 22 U/L (ref 0–35)
AST: 24 U/L (ref 0–37)
Albumin: 4.8 g/dL (ref 3.5–5.2)
Alkaline Phosphatase: 73 U/L (ref 39–117)
BUN: 21 mg/dL (ref 6–23)
CO2: 30 meq/L (ref 19–32)
Calcium: 10.2 mg/dL (ref 8.4–10.5)
Chloride: 101 meq/L (ref 96–112)
Creatinine, Ser: 0.85 mg/dL (ref 0.40–1.20)
GFR: 78.06 mL/min (ref >60.00)
Glucose, Bld: 89 mg/dL (ref 70–99)
Potassium: 4.4 meq/L (ref 3.5–5.1)
Sodium: 137 meq/L (ref 135–145)
Total Bilirubin: 0.3 mg/dL (ref 0.2–1.2)
Total Protein: 8 g/dL (ref 6.0–8.3)

## 2023-04-03 LAB — CBC WITH DIFFERENTIAL/PLATELET
Basophils Absolute: 0.1 10*3/uL (ref 0.0–0.1)
Basophils Relative: 0.7 % (ref 0.0–3.0)
Eosinophils Absolute: 0.2 10*3/uL (ref 0.0–0.7)
Eosinophils Relative: 2.2 % (ref 0.0–5.0)
HCT: 40.8 % (ref 36.0–46.0)
Hemoglobin: 12.6 g/dL (ref 12.0–15.0)
Lymphocytes Relative: 35.5 % (ref 12.0–46.0)
Lymphs Abs: 3.9 10*3/uL (ref 0.7–4.0)
MCHC: 30.9 g/dL (ref 30.0–36.0)
MCV: 71.5 fL — ABNORMAL LOW (ref 78.0–100.0)
Monocytes Absolute: 0.9 10*3/uL (ref 0.1–1.0)
Monocytes Relative: 8 % (ref 3.0–12.0)
Neutro Abs: 5.8 10*3/uL (ref 1.4–7.7)
Neutrophils Relative %: 53.6 % (ref 43.0–77.0)
Platelets: 374 10*3/uL (ref 150.0–400.0)
RBC: 5.71 Mil/uL — ABNORMAL HIGH (ref 3.87–5.11)
RDW: 16.5 % — ABNORMAL HIGH (ref 11.5–15.5)
WBC: 10.9 10*3/uL — ABNORMAL HIGH (ref 4.0–10.5)

## 2023-04-03 LAB — TSH: TSH: 2 u[IU]/mL (ref 0.35–5.50)

## 2023-04-03 LAB — FERRITIN: Ferritin: 75.5 ng/mL (ref 10.0–291.0)

## 2023-04-03 LAB — FOLATE: Folate: 21.1 ng/mL (ref >5.9)

## 2023-04-03 LAB — HEMOGLOBIN A1C: Hgb A1c MFr Bld: 6.4 % (ref 4.6–6.5)

## 2023-04-03 LAB — VITAMIN D 25 HYDROXY (VIT D DEFICIENCY, FRACTURES): VITD: 48.77 ng/mL (ref 30.00–100.00)

## 2023-04-03 LAB — MICROALBUMIN / CREATININE URINE RATIO
Creatinine,U: 80.6 mg/dL
Microalb Creat Ratio: 15.4 mg/g (ref 0.0–30.0)
Microalb, Ur: 12.4 mg/dL — ABNORMAL HIGH (ref 0.0–1.9)

## 2023-04-03 MED ORDER — NITROFURANTOIN MONOHYD MACRO 100 MG PO CAPS: 100.0000 mg | ORAL_CAPSULE | Freq: Two times a day (BID) | ORAL | 0 refills | Status: DC

## 2023-04-03 NOTE — Progress Notes (Signed)
New Patient Office Visit  Subjective    Patient ID: Ann Bird, female    DOB: 04-10-1970  Age: 53 y.o. MRN: [id number]  CC:  Chief Complaint  Patient presents with   Establish Care    Coming from [facility] physicians, Dr. Lewie Chamber. Having bladder pressure for the last week and urinary frequency.     HPI Ann Bird presents to establish care Previous PCP at [facility].   OB/GYN- Dr. Burnis Kingfisher  States UTD with mammogram and pap smear    C/o a 1 wk hx of bladder pressure, urinary frequency and urgency.  She has been taking AZO  HTN- on losartan and chlorthalidone  Hx of elevated A1c and cholesterol    Hx of IDA and takes OTC iron  Vitamin D def- taking OTC vitamin D   Denies smoking, drug use.  Occ alcohol use.   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  Children.      04/03/2023   11:06 AM 11/19/2016   11:51 AM 02/27/2016   10:20 AM 02/15/2016    4:05 PM 01/13/2016    5:02 PM  Depression screen PHQ 2/9  Decreased Interest 0 0 0 0 0  Down, Depressed, Hopeless 0 0 0 0 0  PHQ - 2 Score 0 0 0 0 0     Outpatient Encounter Medications as of 04/03/2023  Medication Sig   chlorthalidone (HYGROTON) 25 MG tablet chlorthalidone 25 mg tablet  TAKE 1/2 (ONE-HALF) TABLET BY MOUTH ONCE DAILY AS NEEDED   Cholecalciferol (VITAMIN D) 50 MCG (2000 UT) tablet 1 tablet   IRON, FERROUS SULFATE, PO Take 1 tablet by mouth. Every other day   losartan (COZAAR) 50 MG tablet 1 tablet   Multiple Vitamin (MULTIVITAMIN) tablet Take 1 tablet by mouth daily.   nitrofurantoin, macrocrystal-monohydrate, (MACROBID) 100 MG capsule Take 1 capsule (100 mg total) by mouth 2 (two) times daily.   [DISCONTINUED] hydrochlorothiazide (HYDRODIURIL) 25 MG tablet 1 tablet in the morning   [DISCONTINUED] promethazine-dextromethorphan (PROMETHAZINE-DM) 6.25-15 MG/5ML syrup Take 5 mLs by mouth 4 (four) times daily as needed for cough.   [DISCONTINUED]  sulfamethoxazole-trimethoprim (BACTRIM DS,SEPTRA DS) 800-160 MG tablet Take 1 tablet by mouth 2 (two) times daily.   [DISCONTINUED] triamcinolone cream (KENALOG) 0.1 % Apply 1 application topically 2 (two) times daily.   No facility-administered encounter medications on file as of 04/03/2023.    Past Medical History:  Diagnosis Date   Allergy 3/88   Pollen   Blood transfusion without reported diagnosis 11/89   After child birth   Fibroid, uterine    Hypertension     History reviewed. No pertinent surgical history.  Family History  Problem Relation Age of Onset   Pulmonary embolism Mother    Early death Mother    Hypertension Mother    Colon cancer Father    Early death Father    Vision loss Maternal Grandfather    Diabetes Paternal Grandmother    Heart disease Maternal Aunt     Social History   Socioeconomic History   Marital status: Divorced    Spouse name: Not on file   Number of children: Not on file   Years of education: Not on file   Highest education level: Some college, no degree  Occupational History   Not on file  Tobacco Use   Smoking status: Never   Smokeless tobacco: Never  Substance and Sexual Activity   Alcohol use: Yes    Comment: occasional. 1 glass  of wine every other week   Drug use: No   Sexual activity: Not Currently    Birth control/protection: Post-menopausal, None  Other Topics Concern   Not on file  Social History Narrative   Not on file   Social Drivers of Health   Financial Resource Strain: Low Risk  (04/02/2023)   Overall Financial Resource Strain (CARDIA)    Difficulty of Paying Living Expenses: Not very hard  Food Insecurity: Food Insecurity Present (04/02/2023)   Hunger Vital Sign    Worried About Running Out of Food in the Last Year: Sometimes true    Ran Out of Food in the Last Year: Never true  Transportation Needs: No Transportation Needs (04/02/2023)   PRAPARE - Administrator, Civil Service (Medical): No     Lack of Transportation (Non-Medical): No  Physical Activity: Sufficiently Active (04/02/2023)   Exercise Vital Sign    Days of Exercise per Week: 4 days    Minutes of Exercise per Session: 50 min  Stress: No Stress Concern Present (04/02/2023)   [employer] of Occupational Health - Occupational Stress Questionnaire    Feeling of Stress : Only a little  Social Connections: Moderately Isolated (04/02/2023)   Social Connection and Isolation Panel [NHANES]    Frequency of Communication with Friends and Family: Twice a week    Frequency of Social Gatherings with Friends and Family: More than three times a week    Attends Religious Services: 1 to 4 times per year    Active Member of [employer] or Organizations: No    Attends Engineer, structural: Not on file    Marital Status: Divorced  Intimate Partner Violence: Not on file    Review of Systems  Constitutional:  Negative for chills, fever and malaise/fatigue.  Respiratory:  Negative for shortness of breath.   Cardiovascular:  Negative for chest pain, palpitations and leg swelling.  Gastrointestinal:  Negative for abdominal pain, constipation, diarrhea, nausea and vomiting.  Genitourinary:  Positive for frequency and urgency. Negative for dysuria.  Neurological:  Negative for dizziness and focal weakness.  Psychiatric/Behavioral:  Negative for depression. The patient is not nervous/anxious.         Objective    BP 128/82 (BP Location: Left Arm, Patient Position: Sitting, Cuff Size: Large)   Pulse 85   Temp 97.6 F (36.4 C) (Temporal)   Ht 5' 1.8" (1.57 m)   Wt 195 lb (88.5 kg)   SpO2 98%   BMI 35.90 kg/m   Physical Exam Constitutional:      General: She is not in acute distress.    Appearance: She is not ill-appearing.  Eyes:     Extraocular Movements: Extraocular movements intact.     Conjunctiva/sclera: Conjunctivae normal.  Cardiovascular:     Rate and Rhythm: Normal rate.  Pulmonary:     Effort: Pulmonary  effort is normal.     Breath sounds: Normal breath sounds.  Abdominal:     General: There is no distension.     Palpations: Abdomen is soft. There is no mass.     Tenderness: There is no abdominal tenderness. There is no right CVA tenderness, left CVA tenderness or guarding.  Musculoskeletal:     Cervical back: Normal range of motion and neck supple.     Right lower leg: No edema.     Left lower leg: No edema.  Skin:    General: Skin is warm and dry.  Neurological:     General: No  focal deficit present.     Mental Status: She is alert and oriented to person, place, and time.     Motor: No weakness.     Coordination: Coordination normal.     Gait: Gait normal.  Psychiatric:        Mood and Affect: Mood normal.        Behavior: Behavior normal.        Thought Content: Thought content normal.         Assessment & Plan:   Problem List Items Addressed This Visit     Hyperlipidemia   Relevant Orders   Lipid panel (Completed)   Iron deficiency   Relevant Orders   CBC with Differential/Platelet (Completed)   Ferritin (Completed)   Vitamin B12 (Completed)   Folate (Completed)   Obesity (BMI 30-39.9)   Relevant Orders   CBC with Differential/Platelet (Completed)   Comprehensive metabolic panel (Completed)   Hemoglobin A1c (Completed)   Lipid panel (Completed)   TSH (Completed)   Primary hypertension   Relevant Orders   CBC with Differential/Platelet (Completed)   Comprehensive metabolic panel (Completed)   Proteinuria   Relevant Orders   Microalbumin / creatinine urine ratio (Completed)   Vitamin D deficiency   Relevant Orders   VITAMIN D 25 Hydroxy (Vit-D Deficiency, Fractures) (Completed)   Other Visit Diagnoses       Acute cystitis without hematuria    -  Primary   Relevant Medications   nitrofurantoin, macrocrystal-monohydrate, (MACROBID) 100 MG capsule   Other Relevant Orders   Urine Culture     Urinary frequency       Relevant Orders   POCT Urinalysis  Dipstick (Automated) (Completed)     Encounter to establish care          Pleasant 53 year old female here to establish care.  Macrobid prescribed for UTI. Urine culture sent.  Increase water intake.  Continue current medications and monitor BP at home.  Follow up pending lab results.   Return for pending labs.   Hetty Blend, NP-C

## 2023-04-03 NOTE — Patient Instructions (Signed)
Take the antibiotic as prescribed. Increase your water intake.   I will be in touch with your lab results and with recommendations.

## 2023-04-04 ENCOUNTER — Encounter: Payer: Self-pay | Admitting: Family Medicine

## 2023-04-04 DIAGNOSIS — R7303 Prediabetes: Secondary | ICD-10-CM

## 2023-04-04 HISTORY — DX: Prediabetes: R73.03

## 2023-04-04 LAB — URINE CULTURE: Result:: NO GROWTH

## 2023-04-04 NOTE — Progress Notes (Signed)
She needs a follow up office visit in 4 weeks (non fasting is ok).  See my note to her please: Your urine culture does not indicate a urinary tract infection. Ok to stop the antibiotic. Hopefully, your symptoms have resolved.  Your blood sugars are in the prediabetes range with an A1c 6.4% and diabetes starts at 6.5%. you are very close to having diabetes.  Cut back on sugar and carbohydrates such as potatoes, bread, pasta, rice.  Your LDL (the bad cholesterol) is elevated. Cut back on saturated fat, fried foods and increase fiber. Increasing your physical activity can also help improve your health.  Follow up with me in 4 weeks.

## 2023-06-11 DIAGNOSIS — Z01419 Encounter for gynecological examination (general) (routine) without abnormal findings: Secondary | ICD-10-CM | POA: Diagnosis not present

## 2023-12-06 ENCOUNTER — Ambulatory Visit (INDEPENDENT_AMBULATORY_CARE_PROVIDER_SITE_OTHER): Admitting: Family Medicine

## 2023-12-06 ENCOUNTER — Ambulatory Visit: Payer: Self-pay | Admitting: Family Medicine

## 2023-12-06 VITALS — BP 120/70 | HR 96 | Temp 97.6°F | Ht 61.8 in | Wt 198.0 lb

## 2023-12-06 DIAGNOSIS — E669 Obesity, unspecified: Secondary | ICD-10-CM | POA: Diagnosis not present

## 2023-12-06 DIAGNOSIS — R7303 Prediabetes: Secondary | ICD-10-CM

## 2023-12-06 DIAGNOSIS — Z1159 Encounter for screening for other viral diseases: Secondary | ICD-10-CM | POA: Diagnosis not present

## 2023-12-06 DIAGNOSIS — E785 Hyperlipidemia, unspecified: Secondary | ICD-10-CM

## 2023-12-06 DIAGNOSIS — I10 Essential (primary) hypertension: Secondary | ICD-10-CM

## 2023-12-06 DIAGNOSIS — Z0001 Encounter for general adult medical examination with abnormal findings: Secondary | ICD-10-CM | POA: Diagnosis not present

## 2023-12-06 DIAGNOSIS — Z23 Encounter for immunization: Secondary | ICD-10-CM | POA: Diagnosis not present

## 2023-12-06 LAB — COMPREHENSIVE METABOLIC PANEL WITH GFR
ALT: 20 U/L (ref 0–35)
AST: 20 U/L (ref 0–37)
Albumin: 4.4 g/dL (ref 3.5–5.2)
Alkaline Phosphatase: 71 U/L (ref 39–117)
BUN: 23 mg/dL (ref 6–23)
CO2: 33 meq/L — ABNORMAL HIGH (ref 19–32)
Calcium: 9.8 mg/dL (ref 8.4–10.5)
Chloride: 104 meq/L (ref 96–112)
Creatinine, Ser: 1 mg/dL (ref 0.40–1.20)
GFR: 63.92 mL/min (ref >60.00)
Glucose, Bld: 99 mg/dL (ref 70–99)
Potassium: 4 meq/L (ref 3.5–5.1)
Sodium: 142 meq/L (ref 135–145)
Total Bilirubin: 0.4 mg/dL (ref 0.2–1.2)
Total Protein: 7.2 g/dL (ref 6.0–8.3)

## 2023-12-06 LAB — CBC WITH DIFFERENTIAL/PLATELET
Basophils Absolute: 0.1 K/uL (ref 0.0–0.1)
Basophils Relative: 0.4 % (ref 0.0–3.0)
Eosinophils Absolute: 0.2 K/uL (ref 0.0–0.7)
Eosinophils Relative: 1.8 % (ref 0.0–5.0)
HCT: 37.5 % (ref 36.0–46.0)
Hemoglobin: 11.6 g/dL — ABNORMAL LOW (ref 12.0–15.0)
Lymphocytes Relative: 25.4 % (ref 12.0–46.0)
Lymphs Abs: 3.2 K/uL (ref 0.7–4.0)
MCHC: 30.8 g/dL (ref 30.0–36.0)
MCV: 70.3 fl — ABNORMAL LOW (ref 78.0–100.0)
Monocytes Absolute: 1 K/uL (ref 0.1–1.0)
Monocytes Relative: 7.7 % (ref 3.0–12.0)
Neutro Abs: 8.2 K/uL — ABNORMAL HIGH (ref 1.4–7.7)
Neutrophils Relative %: 64.7 % (ref 43.0–77.0)
Platelets: 330 K/uL (ref 150.0–400.0)
RBC: 5.33 Mil/uL — ABNORMAL HIGH (ref 3.87–5.11)
RDW: 16.6 % — ABNORMAL HIGH (ref 11.5–15.5)
WBC: 12.7 K/uL — ABNORMAL HIGH (ref 4.0–10.5)

## 2023-12-06 LAB — HEMOGLOBIN A1C: Hgb A1c MFr Bld: 6.7 % — ABNORMAL HIGH (ref 4.6–6.5)

## 2023-12-06 LAB — TSH: TSH: 1.24 u[IU]/mL (ref 0.35–5.50)

## 2023-12-06 MED ORDER — LOSARTAN POTASSIUM 50 MG PO TABS: 50.0000 mg | ORAL_TABLET | Freq: Every day | ORAL | 1 refills | Status: AC

## 2023-12-06 NOTE — Patient Instructions (Signed)
 Preventive Care 54-54 Years Old, Female  Preventive care refers to lifestyle choices and visits with your health care provider that can promote health and wellness. Preventive care visits are also called wellness exams.  What can I expect for my preventive care visit?  Counseling  Your health care provider may ask you questions about your:  Medical history, including:  Past medical problems.  Family medical history.  Pregnancy history.  Current health, including:  Menstrual cycle.  Method of birth control.  Emotional well-being.  Home life and relationship well-being.  Sexual activity and sexual health.  Lifestyle, including:  Alcohol, nicotine or tobacco, and drug use.  Access to firearms.  Diet, exercise, and sleep habits.  Work and work Astronomer.  Sunscreen use.  Safety issues such as seatbelt and bike helmet use.  Physical exam  Your health care provider will check your:  Height and weight. These may be used to calculate your BMI (body mass index). BMI is a measurement that tells if you are at a healthy weight.  Waist circumference. This measures the distance around your waistline. This measurement also tells if you are at a healthy weight and may help predict your risk of certain diseases, such as type 2 diabetes and high blood pressure.  Heart rate and blood pressure.  Body temperature.  Skin for abnormal spots.  What immunizations do I need?    Vaccines are usually given at various ages, according to a schedule. Your health care provider will recommend vaccines for you based on your age, medical history, and lifestyle or other factors, such as travel or where you work.  What tests do I need?  Screening  Your health care provider may recommend screening tests for certain conditions. This may include:  Lipid and cholesterol levels.  Diabetes screening. This is done by checking your blood sugar (glucose) after you have not eaten for a while (fasting).  Pelvic exam and Pap test.  Hepatitis B test.  Hepatitis C  test.  HIV (human immunodeficiency virus) test.  STI (sexually transmitted infection) testing, if you are at risk.  Lung cancer screening.  Colorectal cancer screening.  Mammogram. Talk with your health care provider about when you should start having regular mammograms. This may depend on whether you have a family history of breast cancer.  BRCA-related cancer screening. This may be done if you have a family history of breast, ovarian, tubal, or peritoneal cancers.  Bone density scan. This is done to screen for osteoporosis.  Talk with your health care provider about your test results, treatment options, and if necessary, the need for more tests.  Follow these instructions at home:  Eating and drinking    Eat a diet that includes fresh fruits and vegetables, whole grains, lean protein, and low-fat dairy products.  Take vitamin and mineral supplements as recommended by your health care provider.  Do not drink alcohol if:  Your health care provider tells you not to drink.  You are pregnant, may be pregnant, or are planning to become pregnant.  If you drink alcohol:  Limit how much you have to 0-1 drink a day.  Know how much alcohol is in your drink. In the [place] one drink equals one 12 oz bottle of beer (355 mL), one 5 oz glass of wine (148 mL), or one 1 oz glass of hard liquor (44 mL).  Lifestyle  Brush your teeth every morning and night with fluoride toothpaste. Floss one time each day.  Exercise for at least  30 minutes 5 or more days each week.  Do not use any products that contain nicotine or tobacco. These products include cigarettes, chewing tobacco, and vaping devices, such as e-cigarettes. If you need help quitting, ask your health care provider.  Do not use drugs.  If you are sexually active, practice safe sex. Use a condom or other form of protection to prevent STIs.  If you do not wish to become pregnant, use a form of birth control. If you plan to become pregnant, see your health care provider for a  prepregnancy visit.  Take aspirin only as told by your health care provider. Make sure that you understand how much to take and what form to take. Work with your health care provider to find out whether it is safe and beneficial for you to take aspirin daily.  Find healthy ways to manage stress, such as:  Meditation, yoga, or listening to music.  Journaling.  Talking to a trusted person.  Spending time with friends and family.  Minimize exposure to UV radiation to reduce your risk of skin cancer.  Safety  Always wear your seat belt while driving or riding in a vehicle.  Do not drive:  If you have been drinking alcohol. Do not ride with someone who has been drinking.  When you are tired or distracted.  While texting.  If you have been using any mind-altering substances or drugs.  Wear a helmet and other protective equipment during sports activities.  If you have firearms in your house, make sure you follow all gun safety procedures.  Seek help if you have been physically or sexually abused.  What's next?  Visit your health care provider once a year for an annual wellness visit.  Ask your health care provider how often you should have your eyes and teeth checked.  Stay up to date on all vaccines.  This information is not intended to replace advice given to you by your health care provider. Make sure you discuss any questions you have with your health care provider.  Document Revised: 08/24/2020 Document Reviewed: 08/24/2020  Elsevier Patient Education  2024 [employer].

## 2023-12-06 NOTE — Progress Notes (Signed)
 Complete physical exam  Patient: Ann Bird   DOB: January 10, 1971   53 y.o. Female  MRN: [id number]  Subjective:    Chief Complaint  Patient presents with   Medical Management of Chronic Issues    CPE, BP Medication Refill   She is here for a complete physical exam.   Discussed the use of AI scribe software for clinical note transcription with the patient, who gave verbal consent to proceed.  History of Present Illness Ann Bird is a 53 year old with prediabetes and hypertension who presents for her annual physical exam and follow-up on chronic health conditions.  Glycemic control - Prediabetes in January 2025, A1c 6.4% - Intermittently monitors sugar and carbohydrate intake - Family history of diabetes on paternal side, including paternal grandmother  Hypertension - Currently managed with losartan  - Previously took chlorthalidone, discontinued over a year ago - No tobacco use  Weight management - Difficulty with weight loss, increasingly challenging with age  Edema - Occasional swelling in fingers, associated with salt intake  Preventive health maintenance - Up to date with Pap smear - Received influenza vaccination - Declined pneumococcal vaccination at this visit - Dental evaluation 1-2 months ago - No eye examination in over a year    Health Maintenance  Topic Date Due   HIV Screening  Never done   Hepatitis C Screening  Never done   Hepatitis B Vaccine (1 of 3 - 19+ 3-dose series) Never done   Pap with HPV screening  Never done   Pneumococcal Vaccine for age over 38 (1 of 1 - PCV) Never done   COVID-19 Vaccine (1 - 2024-25 season) Never done   Breast Cancer Screening  10/18/2024   Colon Cancer Screening  10/12/2030   DTaP/Tdap/Td vaccine (3 - Td or Tdap) 02/11/2031   Flu Shot  Completed   Zoster (Shingles) Vaccine  Completed   HPV Vaccine  Aged Out   Meningitis B Vaccine  Aged Out    Wears seatbelt always, uses sunscreen, smoke  detectors in home and functioning, does not text while driving, feels safe in home environment.  Depression screening:    04/03/2023   11:06 AM 11/19/2016   11:51 AM 02/27/2016   10:20 AM  Depression screen PHQ 2/9  Decreased Interest 0 0 0  Down, Depressed, Hopeless 0 0 0  PHQ - 2 Score 0 0 0   Anxiety Screening:     No data to display           Patient Care Team: Lendia Boby CROME, NP-C as PCP - General (Family Medicine) Storm Setter, DO as Consulting Physician (Obstetrics and Gynecology)   Outpatient Medications Prior to Visit  Medication Sig   Cholecalciferol (VITAMIN D ) 50 MCG (2000 UT) tablet 1 tablet   IRON, FERROUS SULFATE, PO Take 1 tablet by mouth. Every other day   Multiple Vitamin (MULTIVITAMIN) tablet Take 1 tablet by mouth daily.   [DISCONTINUED] losartan  (COZAAR ) 50 MG tablet 1 tablet   [DISCONTINUED] chlorthalidone (HYGROTON) 25 MG tablet chlorthalidone 25 mg tablet  TAKE 1/2 (ONE-HALF) TABLET BY MOUTH ONCE DAILY AS NEEDED   [DISCONTINUED] nitrofurantoin , macrocrystal-monohydrate, (MACROBID ) 100 MG capsule Take 1 capsule (100 mg total) by mouth 2 (two) times daily.   No facility-administered medications prior to visit.    Review of Systems  Constitutional:  Negative for chills, fever, malaise/fatigue and weight loss.  HENT:  Negative for congestion, ear pain, sinus pain and sore throat.   Eyes:  Negative  for blurred vision, double vision and pain.  Respiratory:  Negative for cough, shortness of breath and wheezing.   Cardiovascular:  Negative for chest pain, palpitations and leg swelling.  Gastrointestinal:  Negative for abdominal pain, constipation, diarrhea, nausea and vomiting.  Genitourinary:  Negative for dysuria, frequency and urgency.  Musculoskeletal:  Negative for back pain, joint pain and myalgias.  Skin:  Negative for rash.  Neurological:  Negative for dizziness, tingling, focal weakness and headaches.  Psychiatric/Behavioral:  Negative for  depression. The patient is not nervous/anxious.        Objective:    BP 120/70 (BP Location: Left Arm, Patient Position: Sitting)   Pulse 96   Temp 97.6 F (36.4 C) (Temporal)   Ht 5' 1.8 (1.57 m)   Wt 198 lb (89.8 kg)   SpO2 97%   BMI 36.45 kg/m  BP Readings from Last 3 Encounters:  12/06/23 120/70  04/03/23 128/82  07/20/20 (!) 148/86   Wt Readings from Last 3 Encounters:  12/06/23 198 lb (89.8 kg)  04/03/23 195 lb (88.5 kg)  11/19/16 203 lb (92.1 kg)    Physical Exam Constitutional:      General: She is not in acute distress.    Appearance: She is not ill-appearing.  HENT:     Right Ear: Tympanic membrane, ear canal and external ear normal.     Left Ear: Tympanic membrane, ear canal and external ear normal.     Nose: Nose normal.     Mouth/Throat:     Mouth: Mucous membranes are moist.     Pharynx: Oropharynx is clear.  Eyes:     Extraocular Movements: Extraocular movements intact.     Conjunctiva/sclera: Conjunctivae normal.     Pupils: Pupils are equal, round, and reactive to light.  Neck:     Thyroid : No thyroid  mass, thyromegaly or thyroid  tenderness.  Cardiovascular:     Rate and Rhythm: Normal rate and regular rhythm.     Pulses: Normal pulses.     Heart sounds: Normal heart sounds.  Pulmonary:     Effort: Pulmonary effort is normal.     Breath sounds: Normal breath sounds.  Abdominal:     General: Bowel sounds are normal.     Palpations: Abdomen is soft.     Tenderness: There is no abdominal tenderness. There is no right CVA tenderness, left CVA tenderness, guarding or rebound.  Musculoskeletal:        General: Normal range of motion.     Cervical back: Normal range of motion and neck supple. No tenderness.     Right lower leg: No edema.     Left lower leg: No edema.  Lymphadenopathy:     Cervical: No cervical adenopathy.  Skin:    General: Skin is warm and dry.     Findings: No lesion or rash.  Neurological:     General: No focal deficit  present.     Mental Status: She is alert and oriented to person, place, and time.     Cranial Nerves: No cranial nerve deficit.     Sensory: No sensory deficit.     Motor: No weakness.     Gait: Gait normal.  Psychiatric:        Mood and Affect: Mood normal.        Behavior: Behavior normal.        Thought Content: Thought content normal.      No results found for any visits on 12/06/23.    Assessment &  Plan:    Routine Health Maintenance and Physical Exam Problem List Items Addressed This Visit     Hyperlipidemia   Relevant Medications   losartan  (COZAAR ) 50 MG tablet   Obesity (BMI 30-39.9)   Prediabetes   Relevant Orders   CBC with Differential/Platelet   Comprehensive metabolic panel with GFR   Hemoglobin A1c   TSH   Primary hypertension   Relevant Medications   losartan  (COZAAR ) 50 MG tablet   Other Relevant Orders   CBC with Differential/Platelet   Comprehensive metabolic panel with GFR   TSH   Other Visit Diagnoses       Encounter for general adult medical examination with abnormal findings    -  Primary     Immunization due       Relevant Orders   Flu vaccine trivalent PF, 6mos and older(Flulaval,Afluria,Fluarix,Fluzone) (Completed)     Encounter for screening for other viral diseases       Relevant Orders   Hepatitis C antibody       Assessment and Plan Assessment & Plan Adult Wellness Visit Annual physical exam conducted. No new surgeries or urgent care visits since last visit. Up to date with Pap smear. Declined pneumonia vaccine. Received flu shot. Regular dental visits. Overdue for vision check. - Conduct physical examination - Order blood work including A1c and kidney function tests - Screen for hepatitis C - Encourage scheduling a vision check  Prediabetes Close to diabetes threshold with previous A1c 6.4% and family history of diabetes on father's side. Prefers lifestyle modifications over medication to prevent progression to diabetes. -  Order A1c test - Discuss lifestyle modifications including diet and exercise - Monitor blood sugar levels  Essential hypertension Blood pressure well-controlled at 120/70 mmHg on losartan . Not taking chlorthalidone for over a year. Importance of salt intake management discussed. - Refill losartan  prescription - Advise on reducing salt intake  Obesity Acknowledges difficulty in losing weight with age. Weight loss discussed as a method to improve blood sugar levels and overall health. - Encourage weight loss through diet and exercise  Hyperlipidemia Cholesterol levels not checked due to recent food intake.     Return in about 6 months (around 06/04/2024) for chronic health conditions, Fasting follow up.     Boby Mackintosh, NP-C

## 2023-12-06 NOTE — Progress Notes (Signed)
 I recommend a 4 to 6-week follow-up.  Her A1c is now in diabetes range.  She also has anemia.  I would like to do more testing.

## 2023-12-07 LAB — HEPATITIS C ANTIBODY: Hepatitis C Ab: NONREACTIVE

## 2024-01-24 ENCOUNTER — Ambulatory Visit: Admitting: Family Medicine

## 2024-01-24 ENCOUNTER — Encounter: Payer: Self-pay | Admitting: Family Medicine

## 2024-01-24 VITALS — BP 122/78 | HR 90 | Temp 97.8°F | Ht 61.8 in | Wt 191.0 lb

## 2024-01-24 DIAGNOSIS — D649 Anemia, unspecified: Secondary | ICD-10-CM

## 2024-01-24 DIAGNOSIS — E119 Type 2 diabetes mellitus without complications: Secondary | ICD-10-CM | POA: Diagnosis not present

## 2024-01-24 DIAGNOSIS — E669 Obesity, unspecified: Secondary | ICD-10-CM | POA: Diagnosis not present

## 2024-01-24 DIAGNOSIS — I10 Essential (primary) hypertension: Secondary | ICD-10-CM | POA: Diagnosis not present

## 2024-01-24 DIAGNOSIS — Z6835 Body mass index (BMI) 35.0-35.9, adult: Secondary | ICD-10-CM | POA: Diagnosis not present

## 2024-01-24 DIAGNOSIS — E559 Vitamin D deficiency, unspecified: Secondary | ICD-10-CM

## 2024-01-24 DIAGNOSIS — J069 Acute upper respiratory infection, unspecified: Secondary | ICD-10-CM

## 2024-01-24 LAB — CBC WITH DIFFERENTIAL/PLATELET
Basophils Absolute: 0.1 K/uL (ref 0.0–0.1)
Basophils Relative: 0.5 % (ref 0.0–3.0)
Eosinophils Absolute: 0.2 K/uL (ref 0.0–0.7)
Eosinophils Relative: 1.8 % (ref 0.0–5.0)
HCT: 37.5 % (ref 36.0–46.0)
Hemoglobin: 11.7 g/dL — ABNORMAL LOW (ref 12.0–15.0)
Lymphocytes Relative: 27.5 % (ref 12.0–46.0)
Lymphs Abs: 3.8 K/uL (ref 0.7–4.0)
MCHC: 31.2 g/dL (ref 30.0–36.0)
MCV: 70.3 fl — ABNORMAL LOW (ref 78.0–100.0)
Monocytes Absolute: 1.2 K/uL — ABNORMAL HIGH (ref 0.1–1.0)
Monocytes Relative: 8.4 % (ref 3.0–12.0)
Neutro Abs: 8.5 K/uL — ABNORMAL HIGH (ref 1.4–7.7)
Neutrophils Relative %: 61.8 % (ref 43.0–77.0)
Platelets: 397 K/uL (ref 150.0–400.0)
RBC: 5.34 Mil/uL — ABNORMAL HIGH (ref 3.87–5.11)
RDW: 16.5 % — ABNORMAL HIGH (ref 11.5–15.5)
WBC: 13.8 K/uL — ABNORMAL HIGH (ref 4.0–10.5)

## 2024-01-24 LAB — BASIC METABOLIC PANEL WITH GFR
BUN: 19 mg/dL (ref 6–23)
CO2: 31 meq/L (ref 19–32)
Calcium: 9.7 mg/dL (ref 8.4–10.5)
Chloride: 101 meq/L (ref 96–112)
Creatinine, Ser: 0.91 mg/dL (ref 0.40–1.20)
GFR: 71.51 mL/min (ref >60.00)
Glucose, Bld: 78 mg/dL (ref 70–99)
Potassium: 4.3 meq/L (ref 3.5–5.1)
Sodium: 138 meq/L (ref 135–145)

## 2024-01-24 LAB — MICROALBUMIN / CREATININE URINE RATIO
Creatinine,U: 101.4 mg/dL
Microalb Creat Ratio: 106.6 mg/g — ABNORMAL HIGH (ref 0.0–30.0)
Microalb, Ur: 10.8 mg/dL — ABNORMAL HIGH (ref 0.0–1.9)

## 2024-01-24 LAB — FERRITIN: Ferritin: 135.3 ng/mL (ref 10.0–291.0)

## 2024-01-24 LAB — FOLATE: Folate: 9.7 ng/mL (ref >5.9)

## 2024-01-24 LAB — VITAMIN B12: Vitamin B-12: 720 pg/mL (ref 211–911)

## 2024-01-24 NOTE — Patient Instructions (Signed)
 Please go downstairs for labs and urine test before you leave.  They will also give you a stool kit to return to rule out any sort of gastrointestinal bleeding.  Continue reducing sugar and carbohydrates in your diet.  Schedule a diabetic eye exam  I will see you back in 3 months.  Please come to that visit fasting (nothing to eat or drink except water for 8 hours).

## 2024-01-24 NOTE — Progress Notes (Signed)
 Subjective:     Patient ID: Ann Bird, female    DOB: 09/11/1970, 53 y.o.   MRN: [id number]  Chief Complaint  Patient presents with   Medical Management of Chronic Issues    6 month f/u    HPI  Discussed the use of AI scribe software for clinical note transcription with the patient, who gave verbal consent to proceed.  History of Present Illness Ann Bird is a 53 year old with new onset diabetes and anemia who presents for follow-up.  Hyperglycemia and diabetes mellitus - Hemoglobin A1c increased to 6.7% (previously 6.4% nine months ago) - No current use of diabetes medication - Actively reducing carbohydrate intake and avoiding sugary drinks - Paternal family history of diabetes  Anemia - History of anemia  - Postmenopausal status - No gastrointestinal bleeding - Diet includes iron-rich foods - Iron supplementation taken inconsistently due to constipation - Previous iron, vitamin D , B12, and folate levels within normal limits - Seeking new gynecologist for further evaluation  Upper respiratory symptoms - Recent fever and congestion consistent with viral illness - COVID-19 test negative - Persistent congestion and thick mucus - Uses Mucinex, sinus medication containing Sudafed, and Flonase for symptom relief - No flu symptoms - Received influenza vaccination     Health Maintenance Due  Topic Date Due   FOOT EXAM  Never done   OPHTHALMOLOGY EXAM  Never done   HIV Screening  Never done   Diabetic kidney evaluation - Urine ACR  Never done   Hepatitis B Vaccines 19-59 Average Risk (1 of 3 - 19+ 3-dose series) Never done   Cervical Cancer Screening (HPV/Pap Cotest)  Never done   Pneumococcal Vaccine: 50+ Years (1 of 1 - PCV) Never done    Past Medical History:  Diagnosis Date   Allergy 3/88   Pollen   Blood transfusion without reported diagnosis 11/89   After child birth   Fibroid, uterine    Hypertension    Prediabetes 04/04/2023     History reviewed. No pertinent surgical history.  Family History  Problem Relation Age of Onset   Pulmonary embolism Mother    Early death Mother    Hypertension Mother    Colon cancer Father    Early death Father    Vision loss Maternal Grandfather    Diabetes Paternal Grandmother    Heart disease Maternal Aunt     Social History   Socioeconomic History   Marital status: Divorced    Spouse name: Not on file   Number of children: Not on file   Years of education: Not on file   Highest education level: Some college, no degree  Occupational History   Not on file  Tobacco Use   Smoking status: Never   Smokeless tobacco: Never  Substance and Sexual Activity   Alcohol use: Yes    Comment: occasional. 1 glass of wine every other week   Drug use: No   Sexual activity: Not Currently    Birth control/protection: Post-menopausal, None  Other Topics Concern   Not on file  Social History Narrative   Not on file   Social Drivers of Health   Financial Resource Strain: Low Risk  (12/06/2023)   Overall Financial Resource Strain (CARDIA)    Difficulty of Paying Living Expenses: Not very hard  Food Insecurity: Food Insecurity Present (12/06/2023)   Hunger Vital Sign    Worried About Running Out of Food in the Last Year: Sometimes true  Ran Out of Food in the Last Year: Sometimes true  Transportation Needs: No Transportation Needs (12/06/2023)   PRAPARE - Administrator, Civil Service (Medical): No    Lack of Transportation (Non-Medical): No  Physical Activity: Insufficiently Active (12/06/2023)   Exercise Vital Sign    Days of Exercise per Week: 4 days    Minutes of Exercise per Session: 30 min  Stress: No Stress Concern Present (12/06/2023)   [employer] of Occupational Health - Occupational Stress Questionnaire    Feeling of Stress: Only a little  Social Connections: Socially Isolated (12/06/2023)   Social Connection and Isolation Panel    Frequency of  Communication with Friends and Family: More than three times a week    Frequency of Social Gatherings with Friends and Family: Once a week    Attends Religious Services: Patient declined    Database Administrator or Organizations: No    Attends Engineer, Structural: Not on file    Marital Status: Divorced  Intimate Partner Violence: Not on file    Outpatient Medications Prior to Visit  Medication Sig Dispense Refill   Cholecalciferol (VITAMIN D ) 50 MCG (2000 UT) tablet 1 tablet     IRON, FERROUS SULFATE, PO Take 1 tablet by mouth. Every other day     losartan  (COZAAR ) 50 MG tablet Take 1 tablet (50 mg total) by mouth daily. 90 tablet 1   Multiple Vitamin (MULTIVITAMIN) tablet Take 1 tablet by mouth daily.     No facility-administered medications prior to visit.    No Known Allergies  Review of Systems  Constitutional:  Negative for chills and fever.  HENT:  Positive for congestion.   Respiratory:  Negative for shortness of breath.   Cardiovascular:  Negative for chest pain, palpitations and leg swelling.  Gastrointestinal:  Negative for abdominal pain, constipation, diarrhea, nausea and vomiting.  Genitourinary:  Negative for dysuria, frequency and urgency.  Neurological:  Negative for dizziness.       Objective:    Physical Exam Constitutional:      General: She is not in acute distress.    Appearance: She is not ill-appearing.  Eyes:     Extraocular Movements: Extraocular movements intact.     Conjunctiva/sclera: Conjunctivae normal.  Cardiovascular:     Rate and Rhythm: Normal rate.  Pulmonary:     Effort: Pulmonary effort is normal.  Musculoskeletal:     Cervical back: Normal range of motion and neck supple.  Skin:    General: Skin is warm and dry.  Neurological:     General: No focal deficit present.     Mental Status: She is alert and oriented to person, place, and time.  Psychiatric:        Mood and Affect: Mood normal.        Behavior: Behavior  normal.        Thought Content: Thought content normal.      BP 122/78   Pulse 90   Temp 97.8 F (36.6 C) (Temporal)   Ht 5' 1.8 (1.57 m)   Wt 191 lb (86.6 kg)   SpO2 98%   BMI 35.16 kg/m  Wt Readings from Last 3 Encounters:  01/24/24 191 lb (86.6 kg)  12/06/23 198 lb (89.8 kg)  04/03/23 195 lb (88.5 kg)       Assessment & Plan:   Problem List Items Addressed This Visit     New onset type 2 diabetes mellitus (HCC) - Primary  Relevant Orders   CBC with Differential/Platelet   Basic metabolic panel with GFR   Microalbumin / creatinine urine ratio   Obesity (BMI 30-39.9)   Primary hypertension   Vitamin D  deficiency   Other Visit Diagnoses       Anemia, unspecified type       Relevant Orders   CBC with Differential/Platelet   Ferritin   Folate   Vitamin B12   Fecal occult blood, imunochemical     Acute URI           Assessment and Plan Assessment & Plan Type 2 diabetes mellitus, new onset New onset type 2 diabetes mellitus with an A1c of 6.7%, previously in the prediabetes range at 6.4% nine months ago. Family history of diabetes on the paternal side. Currently controlled without medication. She has reduced carbohydrate intake but continues to consume some carbohydrates. No current interest in starting medication. Discussed potential future treatment options including metformin and Mounjaro if A1c increases. Emphasized the importance of lifestyle modifications to potentially revert to prediabetes or normal range. Discussed the need for annual diabetic eye exams, foot checks, and urine protein checks. Highlighted the cardiovascular risks associated with diabetes and the importance of managing cholesterol levels. - Will recheck A1c in 3 months. - Encouraged continued dietary modifications to reduce carbohydrate intake. - Discussed potential future treatment options including metformin and Mounjaro if A1c increases. - recommend diabetic eye exam. - Ordered annual  urine protein check. - Advised on cholesterol management due to increased cardiovascular risk with diabetes.  Anemia, evaluation and management Chronic anemia. No current menstrual cycles or visible blood loss. Diet includes iron-rich foods, but inconsistent use of over-the-counter iron supplements due to constipation. Previous labs showed normal iron, vitamin D , B12, and folate levels. No recent colonoscopy, but previous colonoscopy was negative for polyps. Plan to evaluate current hemoglobin, B12, and folate levels and rule out occult blood loss. - Ordered hemoglobin, B12, and folate levels. - Ordered stool test to check for occult blood. - Advised taking iron supplements with a stool softener to manage constipation. - Requested records from [facility] regarding previous colonoscopy.  Acute upper respiratory symptoms (congestion) Persistent congestion and drainage, possibly related to recent COVID-19 infection, though not confirmed by testing. Symptoms include thick mucus and congestion. Discussed symptomatic treatment options including decongestants, nasal sprays, and anti-inflammatories. Emphasized the importance of hydration to thin mucus. - Continue Mucinex for congestion. - Use Flonase nasal spray regularly. - Use saline nasal spray or nasal rinses as needed. - Take ibuprofen  or Aleve for anti-inflammatory effects. - Ensure adequate hydration to thin mucus.     I am having Tamiah J. Braid maintain her (IRON, FERROUS SULFATE, PO), multivitamin, Vitamin D , and losartan .  No orders of the defined types were placed in this encounter.

## 2024-01-26 ENCOUNTER — Ambulatory Visit: Payer: Self-pay | Admitting: Family Medicine
